# Patient Record
Sex: Female | Born: 1960
Health system: Southern US, Community
[De-identification: ages and names within clinical notes are randomized; demographics above are authoritative.]

## PROBLEM LIST (undated history)

## (undated) DIAGNOSIS — F419 Anxiety disorder, unspecified: Secondary | ICD-10-CM

## (undated) DIAGNOSIS — H269 Unspecified cataract: Secondary | ICD-10-CM

## (undated) DIAGNOSIS — R32 Unspecified urinary incontinence: Secondary | ICD-10-CM

## (undated) DIAGNOSIS — F32A Depression, unspecified: Secondary | ICD-10-CM

## (undated) DIAGNOSIS — F329 Major depressive disorder, single episode, unspecified: Secondary | ICD-10-CM

## (undated) DIAGNOSIS — F259 Schizoaffective disorder, unspecified: Secondary | ICD-10-CM

## (undated) DIAGNOSIS — K219 Gastro-esophageal reflux disease without esophagitis: Secondary | ICD-10-CM

## (undated) HISTORY — PX: OTHER SURGICAL HISTORY: SHX169

## (undated) HISTORY — DX: Depression, unspecified: F32.A

## (undated) HISTORY — PX: CATARACT EXTRACTION: SUR2

## (undated) HISTORY — DX: Gastro-esophageal reflux disease without esophagitis: K21.9

## (undated) HISTORY — PX: EYE SURGERY: SHX253

## (undated) HISTORY — DX: Anxiety disorder, unspecified: F41.9

---
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
## 1998-01-07 ENCOUNTER — Encounter: Admission: RE | Admit: 1998-01-07 | Discharge: 1998-04-07 | Payer: Self-pay | Admitting: Family Medicine

## 1998-04-04 ENCOUNTER — Emergency Department (HOSPITAL_COMMUNITY): Admission: EM | Admit: 1998-04-04 | Discharge: 1998-04-04 | Payer: Self-pay | Admitting: *Deleted

## 1998-04-11 ENCOUNTER — Encounter: Admission: RE | Admit: 1998-04-11 | Discharge: 1998-07-10 | Payer: Self-pay | Admitting: Family Medicine

## 1998-05-06 ENCOUNTER — Encounter: Payer: Self-pay | Admitting: Family Medicine

## 1998-05-06 ENCOUNTER — Ambulatory Visit (HOSPITAL_COMMUNITY): Admission: RE | Admit: 1998-05-06 | Discharge: 1998-05-06 | Payer: Self-pay | Admitting: Family Medicine

## 1998-06-06 ENCOUNTER — Inpatient Hospital Stay (HOSPITAL_COMMUNITY): Admission: AD | Admit: 1998-06-06 | Discharge: 1998-06-09 | Payer: Self-pay | Admitting: Psychiatry

## 1998-06-08 ENCOUNTER — Encounter (HOSPITAL_COMMUNITY): Payer: Self-pay | Admitting: Psychiatry

## 1998-07-11 ENCOUNTER — Encounter: Admission: RE | Admit: 1998-07-11 | Discharge: 1998-10-09 | Payer: Self-pay | Admitting: Family Medicine

## 1998-07-31 ENCOUNTER — Inpatient Hospital Stay (HOSPITAL_COMMUNITY): Admission: AD | Admit: 1998-07-31 | Discharge: 1998-08-04 | Payer: Self-pay | Admitting: Psychiatry

## 1998-12-10 ENCOUNTER — Encounter: Admission: RE | Admit: 1998-12-10 | Discharge: 1999-03-10 | Payer: Self-pay | Admitting: Family Medicine

## 1999-04-06 ENCOUNTER — Encounter: Admission: RE | Admit: 1999-04-06 | Discharge: 1999-07-05 | Payer: Self-pay

## 1999-08-24 ENCOUNTER — Inpatient Hospital Stay (HOSPITAL_COMMUNITY): Admission: EM | Admit: 1999-08-24 | Discharge: 1999-08-26 | Payer: Self-pay | Admitting: *Deleted

## 1999-08-27 ENCOUNTER — Other Ambulatory Visit: Admission: RE | Admit: 1999-08-27 | Discharge: 1999-09-09 | Payer: Self-pay

## 1999-09-30 ENCOUNTER — Encounter: Admission: RE | Admit: 1999-09-30 | Discharge: 1999-12-29 | Payer: Self-pay | Admitting: Family Medicine

## 1999-11-24 ENCOUNTER — Encounter: Payer: Self-pay | Admitting: Family Medicine

## 1999-11-24 ENCOUNTER — Ambulatory Visit (HOSPITAL_COMMUNITY): Admission: RE | Admit: 1999-11-24 | Discharge: 1999-11-24 | Payer: Self-pay | Admitting: Family Medicine

## 1999-12-03 ENCOUNTER — Inpatient Hospital Stay (HOSPITAL_COMMUNITY): Admission: EM | Admit: 1999-12-03 | Discharge: 1999-12-07 | Payer: Self-pay | Admitting: *Deleted

## 2000-01-14 ENCOUNTER — Other Ambulatory Visit: Admission: RE | Admit: 2000-01-14 | Discharge: 2000-01-14 | Payer: Self-pay | Admitting: Obstetrics

## 2000-01-21 ENCOUNTER — Inpatient Hospital Stay (HOSPITAL_COMMUNITY): Admission: EM | Admit: 2000-01-21 | Discharge: 2000-01-24 | Payer: Self-pay | Admitting: *Deleted

## 2000-02-12 ENCOUNTER — Ambulatory Visit (HOSPITAL_COMMUNITY): Admission: RE | Admit: 2000-02-12 | Discharge: 2000-02-12 | Payer: Self-pay | Admitting: *Deleted

## 2000-02-13 ENCOUNTER — Emergency Department (HOSPITAL_COMMUNITY): Admission: EM | Admit: 2000-02-13 | Discharge: 2000-02-14 | Payer: Self-pay

## 2000-02-24 ENCOUNTER — Inpatient Hospital Stay (HOSPITAL_COMMUNITY): Admission: EM | Admit: 2000-02-24 | Discharge: 2000-02-28 | Payer: Self-pay | Admitting: *Deleted

## 2000-02-29 ENCOUNTER — Other Ambulatory Visit (HOSPITAL_COMMUNITY): Admission: RE | Admit: 2000-02-29 | Discharge: 2000-03-10 | Payer: Self-pay | Admitting: Psychiatry

## 2000-03-21 ENCOUNTER — Ambulatory Visit (HOSPITAL_COMMUNITY): Admission: RE | Admit: 2000-03-21 | Discharge: 2000-03-21 | Payer: Self-pay | Admitting: Obstetrics

## 2000-03-21 ENCOUNTER — Encounter: Payer: Self-pay | Admitting: Obstetrics

## 2000-05-08 ENCOUNTER — Emergency Department (HOSPITAL_COMMUNITY): Admission: EM | Admit: 2000-05-08 | Discharge: 2000-05-08 | Payer: Self-pay | Admitting: Emergency Medicine

## 2000-05-17 ENCOUNTER — Encounter: Admission: RE | Admit: 2000-05-17 | Discharge: 2000-08-15 | Payer: Self-pay | Admitting: Family Medicine

## 2000-05-30 ENCOUNTER — Inpatient Hospital Stay (HOSPITAL_COMMUNITY): Admission: EM | Admit: 2000-05-30 | Discharge: 2000-06-03 | Payer: Self-pay | Admitting: Psychiatry

## 2000-06-19 ENCOUNTER — Inpatient Hospital Stay (HOSPITAL_COMMUNITY): Admission: EM | Admit: 2000-06-19 | Discharge: 2000-06-27 | Payer: Self-pay | Admitting: *Deleted

## 2000-08-08 ENCOUNTER — Inpatient Hospital Stay (HOSPITAL_COMMUNITY): Admission: EM | Admit: 2000-08-08 | Discharge: 2000-08-10 | Payer: Self-pay | Admitting: *Deleted

## 2000-08-11 ENCOUNTER — Other Ambulatory Visit (HOSPITAL_COMMUNITY): Admission: RE | Admit: 2000-08-11 | Discharge: 2000-08-17 | Payer: Self-pay | Admitting: Psychiatry

## 2000-08-19 ENCOUNTER — Encounter: Payer: Self-pay | Admitting: Emergency Medicine

## 2000-08-19 ENCOUNTER — Observation Stay (HOSPITAL_COMMUNITY): Admission: EM | Admit: 2000-08-19 | Discharge: 2000-08-20 | Payer: Self-pay | Admitting: Emergency Medicine

## 2000-08-22 ENCOUNTER — Inpatient Hospital Stay (HOSPITAL_COMMUNITY): Admission: EM | Admit: 2000-08-22 | Discharge: 2000-08-26 | Payer: Self-pay | Admitting: *Deleted

## 2000-11-26 ENCOUNTER — Inpatient Hospital Stay (HOSPITAL_COMMUNITY): Admission: EM | Admit: 2000-11-26 | Discharge: 2000-11-30 | Payer: Self-pay | Admitting: Psychiatry

## 2001-03-03 ENCOUNTER — Encounter: Admission: RE | Admit: 2001-03-03 | Discharge: 2001-06-01 | Payer: Self-pay | Admitting: Family Medicine

## 2001-03-07 ENCOUNTER — Inpatient Hospital Stay (HOSPITAL_COMMUNITY): Admission: AD | Admit: 2001-03-07 | Discharge: 2001-03-11 | Payer: Self-pay | Admitting: Psychiatry

## 2001-05-24 ENCOUNTER — Inpatient Hospital Stay (HOSPITAL_COMMUNITY): Admission: EM | Admit: 2001-05-24 | Discharge: 2001-05-30 | Payer: Self-pay | Admitting: Psychiatry

## 2001-07-22 ENCOUNTER — Inpatient Hospital Stay (HOSPITAL_COMMUNITY): Admission: AD | Admit: 2001-07-22 | Discharge: 2001-08-02 | Payer: Self-pay | Admitting: Psychiatry

## 2001-08-03 ENCOUNTER — Other Ambulatory Visit (HOSPITAL_COMMUNITY): Admission: RE | Admit: 2001-08-03 | Discharge: 2001-08-07 | Payer: Self-pay | Admitting: *Deleted

## 2001-09-04 ENCOUNTER — Encounter: Payer: Self-pay | Admitting: Family Medicine

## 2001-09-04 ENCOUNTER — Ambulatory Visit (HOSPITAL_COMMUNITY): Admission: RE | Admit: 2001-09-04 | Discharge: 2001-09-04 | Payer: Self-pay | Admitting: Family Medicine

## 2001-09-25 ENCOUNTER — Inpatient Hospital Stay (HOSPITAL_COMMUNITY): Admission: EM | Admit: 2001-09-25 | Discharge: 2001-09-29 | Payer: Self-pay | Admitting: Psychiatry

## 2001-11-06 ENCOUNTER — Inpatient Hospital Stay (HOSPITAL_COMMUNITY): Admission: EM | Admit: 2001-11-06 | Discharge: 2001-11-11 | Payer: Self-pay | Admitting: Psychiatry

## 2001-11-21 ENCOUNTER — Inpatient Hospital Stay (HOSPITAL_COMMUNITY): Admission: EM | Admit: 2001-11-21 | Discharge: 2001-12-05 | Payer: Self-pay | Admitting: Psychiatry

## 2001-11-26 ENCOUNTER — Encounter: Payer: Self-pay | Admitting: *Deleted

## 2001-11-26 ENCOUNTER — Emergency Department (HOSPITAL_COMMUNITY): Admission: EM | Admit: 2001-11-26 | Discharge: 2001-11-26 | Payer: Self-pay | Admitting: *Deleted

## 2001-11-27 ENCOUNTER — Emergency Department (HOSPITAL_COMMUNITY): Admission: EM | Admit: 2001-11-27 | Discharge: 2001-11-27 | Payer: Self-pay | Admitting: Emergency Medicine

## 2001-12-05 ENCOUNTER — Inpatient Hospital Stay (HOSPITAL_COMMUNITY): Admission: EM | Admit: 2001-12-05 | Discharge: 2001-12-16 | Payer: Self-pay | Admitting: Psychiatry

## 2001-12-12 ENCOUNTER — Ambulatory Visit (HOSPITAL_COMMUNITY): Admission: RE | Admit: 2001-12-12 | Discharge: 2001-12-12 | Payer: Self-pay | Admitting: Psychiatry

## 2001-12-12 ENCOUNTER — Encounter (HOSPITAL_COMMUNITY): Payer: Self-pay | Admitting: Psychiatry

## 2001-12-18 ENCOUNTER — Other Ambulatory Visit (HOSPITAL_COMMUNITY): Admission: RE | Admit: 2001-12-18 | Discharge: 2001-12-20 | Payer: Self-pay | Admitting: Psychiatry

## 2001-12-20 ENCOUNTER — Inpatient Hospital Stay (HOSPITAL_COMMUNITY): Admission: EM | Admit: 2001-12-20 | Discharge: 2002-01-08 | Payer: Self-pay | Admitting: Psychiatry

## 2002-01-22 ENCOUNTER — Other Ambulatory Visit (HOSPITAL_COMMUNITY): Admission: RE | Admit: 2002-01-22 | Discharge: 2002-01-26 | Payer: Self-pay | Admitting: Psychiatry

## 2002-05-16 ENCOUNTER — Inpatient Hospital Stay (HOSPITAL_COMMUNITY): Admission: EM | Admit: 2002-05-16 | Discharge: 2002-05-29 | Payer: Self-pay | Admitting: Psychiatry

## 2003-04-27 DIAGNOSIS — R32 Unspecified urinary incontinence: Secondary | ICD-10-CM

## 2003-04-27 HISTORY — DX: Unspecified urinary incontinence: R32

## 2003-07-12 ENCOUNTER — Inpatient Hospital Stay (HOSPITAL_COMMUNITY): Admission: RE | Admit: 2003-07-12 | Discharge: 2003-07-19 | Payer: Self-pay | Admitting: Psychiatry

## 2003-09-27 ENCOUNTER — Ambulatory Visit (HOSPITAL_COMMUNITY): Admission: RE | Admit: 2003-09-27 | Discharge: 2003-09-27 | Payer: Self-pay | Admitting: Obstetrics

## 2004-01-29 ENCOUNTER — Ambulatory Visit (HOSPITAL_BASED_OUTPATIENT_CLINIC_OR_DEPARTMENT_OTHER): Admission: RE | Admit: 2004-01-29 | Discharge: 2004-01-29 | Payer: Self-pay | Admitting: General Surgery

## 2004-01-29 ENCOUNTER — Ambulatory Visit (HOSPITAL_COMMUNITY): Admission: RE | Admit: 2004-01-29 | Discharge: 2004-01-29 | Payer: Self-pay | Admitting: General Surgery

## 2004-01-29 ENCOUNTER — Encounter (INDEPENDENT_AMBULATORY_CARE_PROVIDER_SITE_OTHER): Payer: Self-pay | Admitting: Specialist

## 2004-08-20 ENCOUNTER — Inpatient Hospital Stay (HOSPITAL_COMMUNITY): Admission: RE | Admit: 2004-08-20 | Discharge: 2004-08-25 | Payer: Self-pay | Admitting: Psychiatry

## 2004-08-20 ENCOUNTER — Ambulatory Visit: Payer: Self-pay | Admitting: Psychiatry

## 2004-10-29 ENCOUNTER — Emergency Department (HOSPITAL_COMMUNITY): Admission: EM | Admit: 2004-10-29 | Discharge: 2004-10-29 | Payer: Self-pay | Admitting: Emergency Medicine

## 2005-04-01 ENCOUNTER — Inpatient Hospital Stay (HOSPITAL_COMMUNITY): Admission: RE | Admit: 2005-04-01 | Discharge: 2005-04-06 | Payer: Self-pay | Admitting: Psychiatry

## 2005-04-02 ENCOUNTER — Ambulatory Visit: Payer: Self-pay | Admitting: Psychiatry

## 2005-04-03 ENCOUNTER — Encounter: Payer: Self-pay | Admitting: *Deleted

## 2005-05-22 ENCOUNTER — Emergency Department (HOSPITAL_COMMUNITY): Admission: EM | Admit: 2005-05-22 | Discharge: 2005-05-22 | Payer: Self-pay | Admitting: Emergency Medicine

## 2005-09-17 ENCOUNTER — Encounter (INDEPENDENT_AMBULATORY_CARE_PROVIDER_SITE_OTHER): Payer: Self-pay | Admitting: *Deleted

## 2005-09-17 ENCOUNTER — Ambulatory Visit (HOSPITAL_COMMUNITY): Admission: RE | Admit: 2005-09-17 | Discharge: 2005-09-17 | Payer: Self-pay | Admitting: Gastroenterology

## 2005-12-10 IMAGING — CT CT CHEST W/ CM
2 of 3 series · 15 of 36 positions shown, 18 images · IV contrast (APPLIED)
Comparison: chest radiograph of last evening

CLINICAL DATA: followup of abnormal chest radiograph
CHEST CT WITH CONTRAST:
TECHNIQUE: Multidetector CT imaging of the chest was performed following the standard protocol during bolus administration of intravenous contrast.
Contrast:  100 cc Omnipaque 300

[Series 2: routine chest 5.0 st · axial · 0.59mm/px · z∈[-325,-85]mm · 12 of 58 slices shown, 15 images]
[im 5/58  mediastinal]
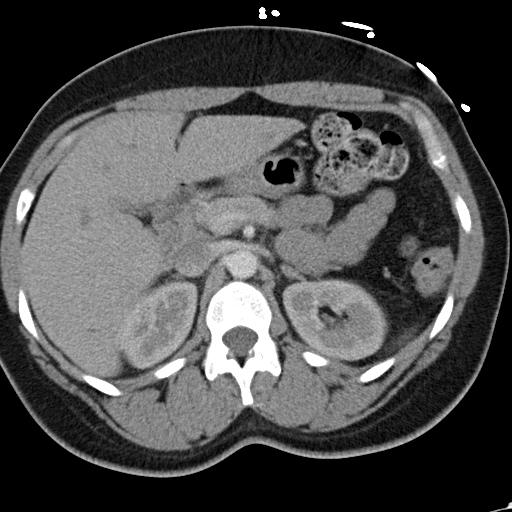
[im 5/58  lung]
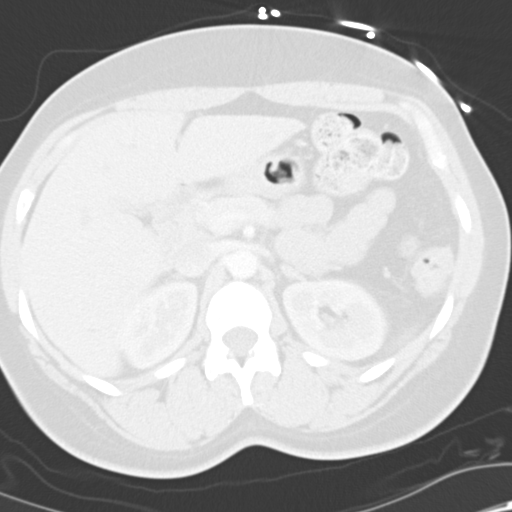
[im 9/58  lung]
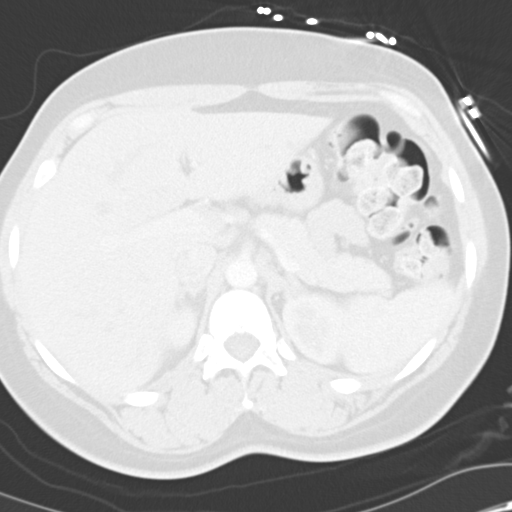
[im 13/58  lung]
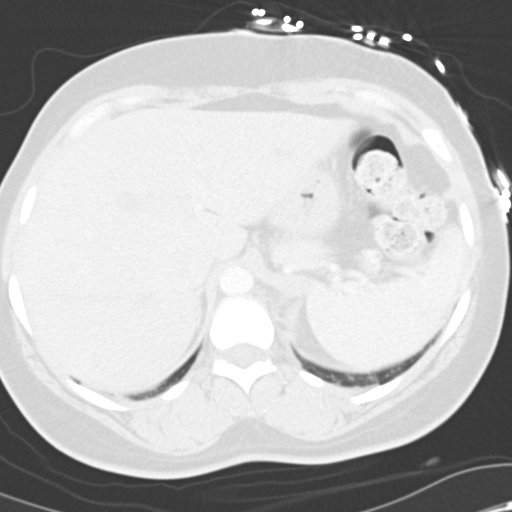
[im 17/58  lung]
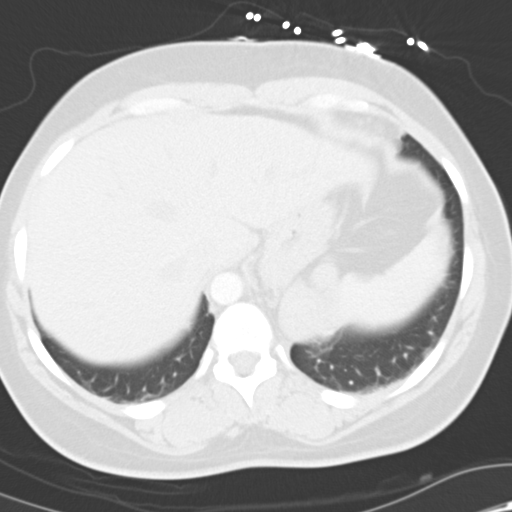
[im 22/58  mediastinal]
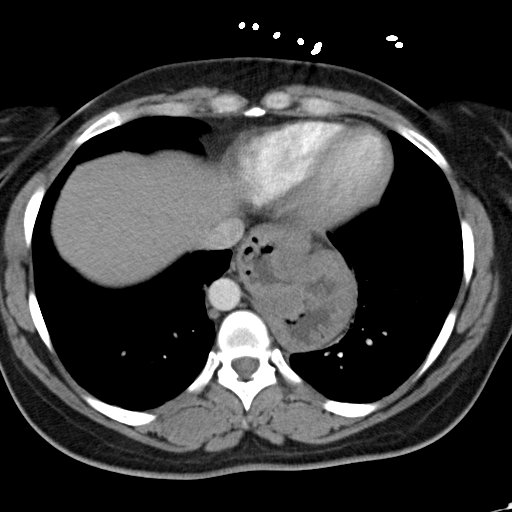
[im 22/58  lung]
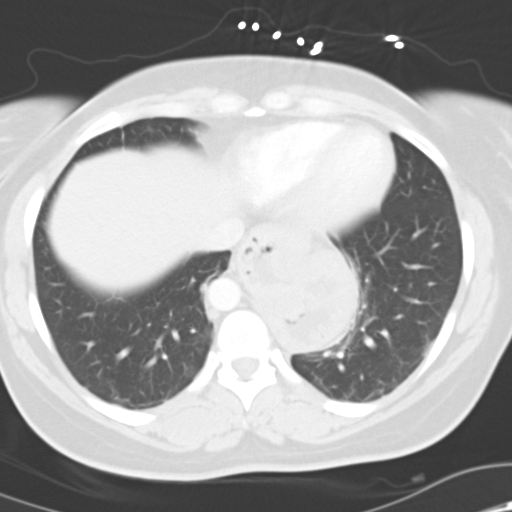
[im 26/58  lung]
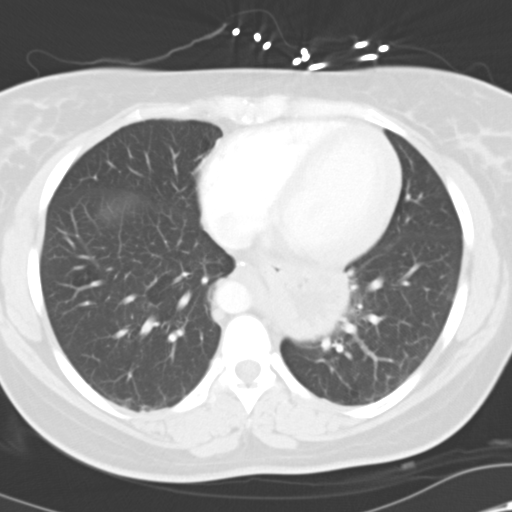
[im 32/58  lung]
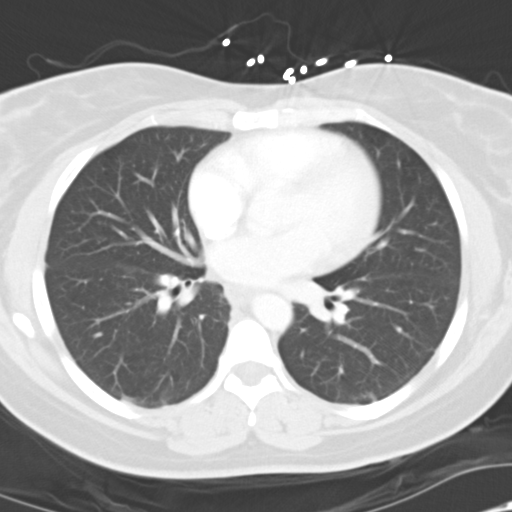
[im 36/58  lung]
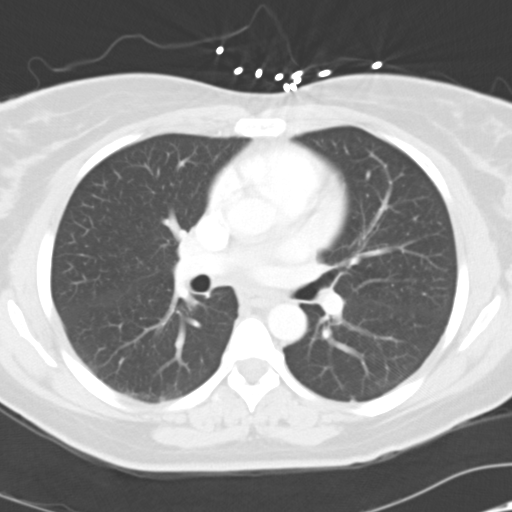
[im 41/58  mediastinal]
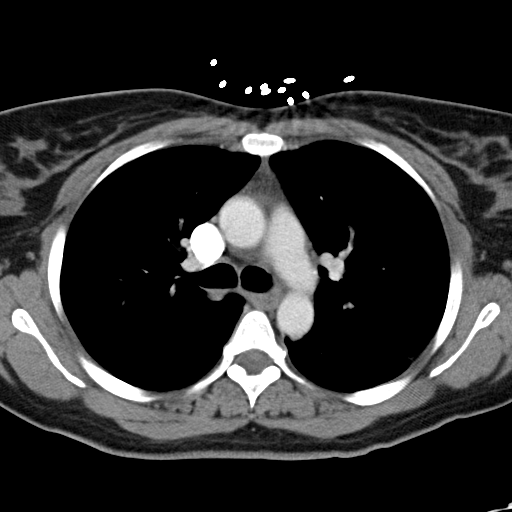
[im 41/58  lung]
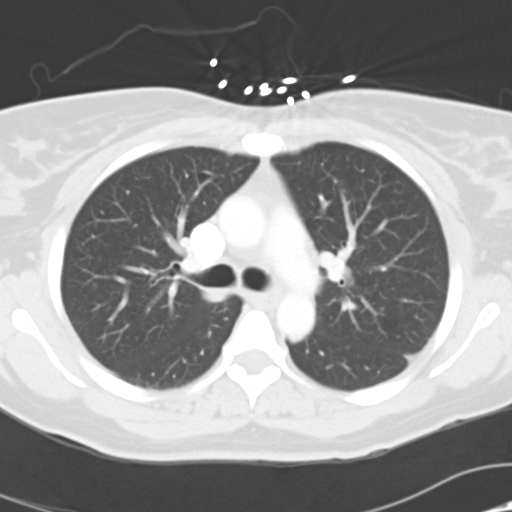
[im 45/58  lung]
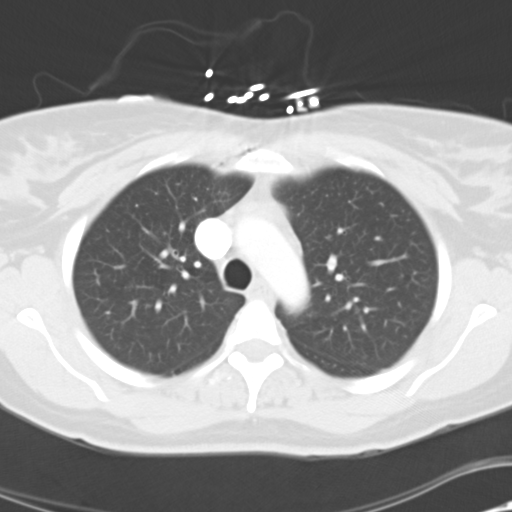
[im 49/58  lung]
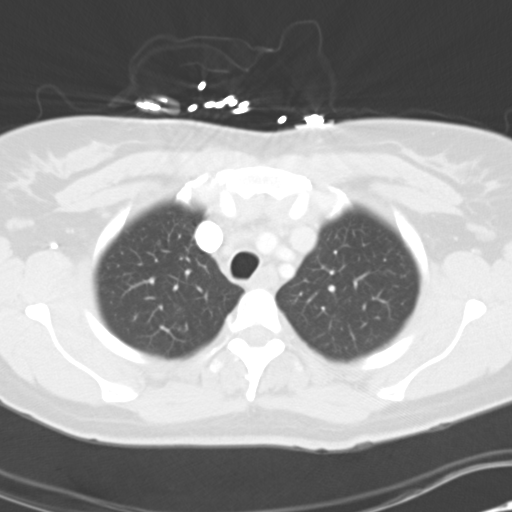
[im 53/58  lung]
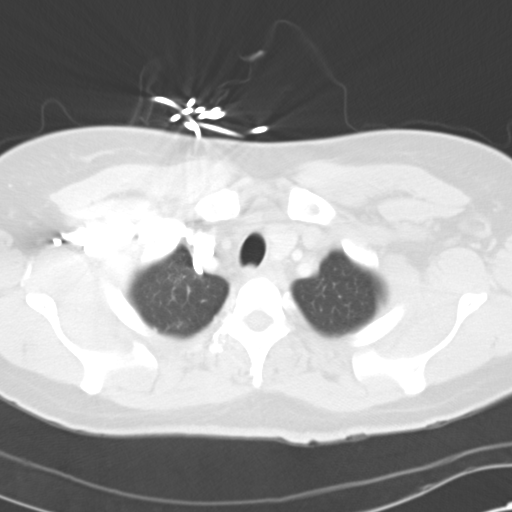

[Series 4: routine chest 2.0 st · coronal · 0.59mm/px · 3 of 106 slices shown]
[im 22/106  lung]
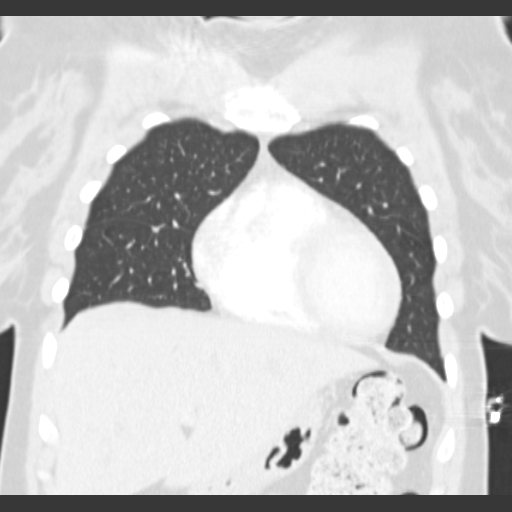
[im 43/106  lung]
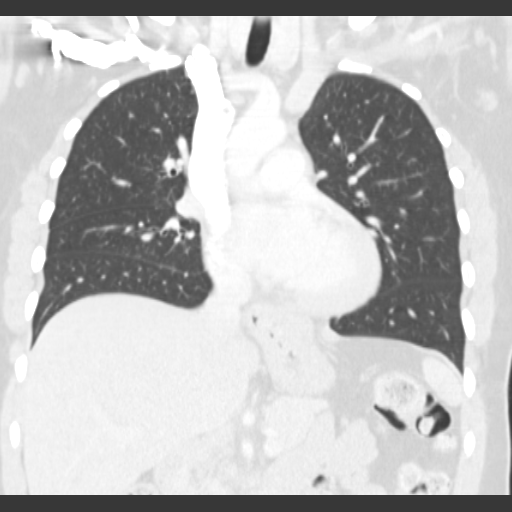
[im 64/106  lung]
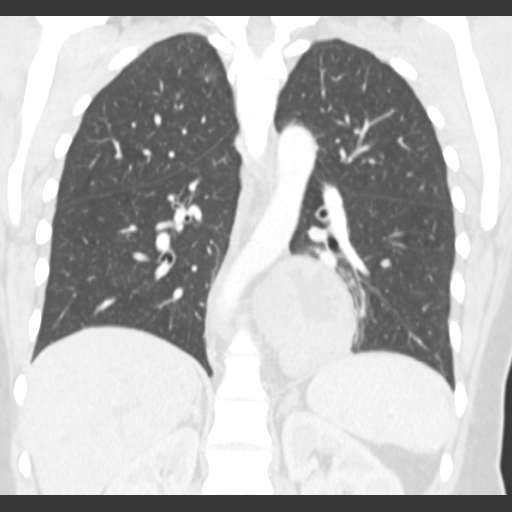

[15 of 36 positions shown; findings below may reference images not displayed]

FINDINGS: Lung windows demonstrate a right-sided lung nodule at 4mm on image #26.  There is likely two millimetric nodules together in the left upper lobe on image # 19.  A faint subpleural 3 to 4mm nodule is seen in the left lung on image # 29.  There is no evidence of pneumonia.

Soft tissue windows demonstrate heart size at the upper limits of normal.  No pericardial or pleural effusion.  Calcified mediastinal lymph nodes.  
The retrocardiac density corresponds to a moderate-sized hiatal hernia with the proximal half of the stomach positioned within the lower chest.  Apparent soft tissue thickening within the proximal stomach on image # 35 is likely due to under distention and redundancy at the GE junction.  Small prevascular lymph node on image # 15 is not pathologic.  Limited imaging of the upper abdomen is otherwise within normal limits.
Bone windows demonstrate mild collateral vessel formation about the upper right chest which is likely secondary to positional central venous insufficiency. No destructive osseous lesion.
IMPRESSION: 1.  The plain film abnormality corresponds to a moderate-sized hiatal hernia.  Apparent soft tissue thickening along the proximal stomach is felt to be due to redundancy.  However, consider nonemergent outpatient barium swallow to exclude esophagitis or proximal gastritis.  
2.  Scattered small lung nodules, the largest measures 4mm.  Recommend followup in approximately 9 months with chest CT to confirm stability.
3.  No other explanation within the chest for patient?s symptoms.

## 2006-06-07 ENCOUNTER — Ambulatory Visit (HOSPITAL_COMMUNITY): Admission: RE | Admit: 2006-06-07 | Discharge: 2006-06-07 | Payer: Self-pay | Admitting: Family Medicine

## 2006-07-21 ENCOUNTER — Encounter: Admission: RE | Admit: 2006-07-21 | Discharge: 2006-07-21 | Payer: Self-pay | Admitting: *Deleted

## 2006-12-28 ENCOUNTER — Encounter: Admission: RE | Admit: 2006-12-28 | Discharge: 2007-03-28 | Payer: Self-pay | Admitting: Family Medicine

## 2009-02-25 ENCOUNTER — Other Ambulatory Visit: Admission: RE | Admit: 2009-02-25 | Discharge: 2009-02-25 | Payer: Self-pay | Admitting: Family Medicine

## 2009-03-25 ENCOUNTER — Encounter: Admission: RE | Admit: 2009-03-25 | Discharge: 2009-03-25 | Payer: Self-pay | Admitting: Family Medicine

## 2009-12-01 IMAGING — US US TRANSVAGINAL NON-OB
1 series · 14 of 25 positions shown · non-contrast
Comparison: None

CLINICAL DATA: Pelvic mass on physical exam

TRANSABDOMINAL AND TRANSVAGINAL ULTRASOUND OF PELVIS
TECHNIQUE: Both transabdominal and transvaginal ultrasound
examinations of the pelvis were performed including evaluation of
the uterus, ovaries, adnexal regions, and pelvic cul-de-sac.

[Series 1: us transvaginal non-ob · 0.35mm/px · 14 of 81 slices shown]
[im 1/81]
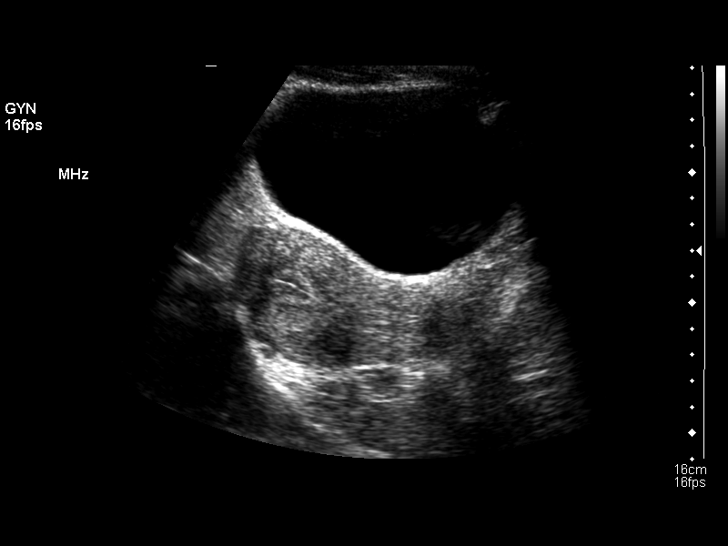
[im 7/81]
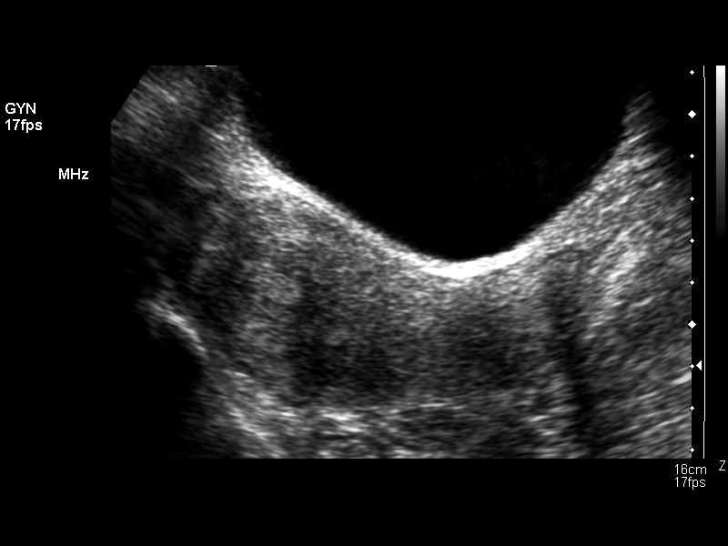
[im 14/81]
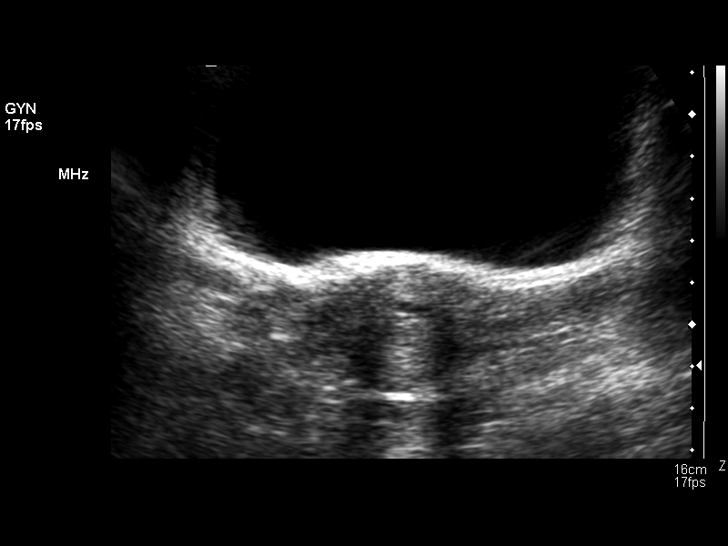
[im 21/81]
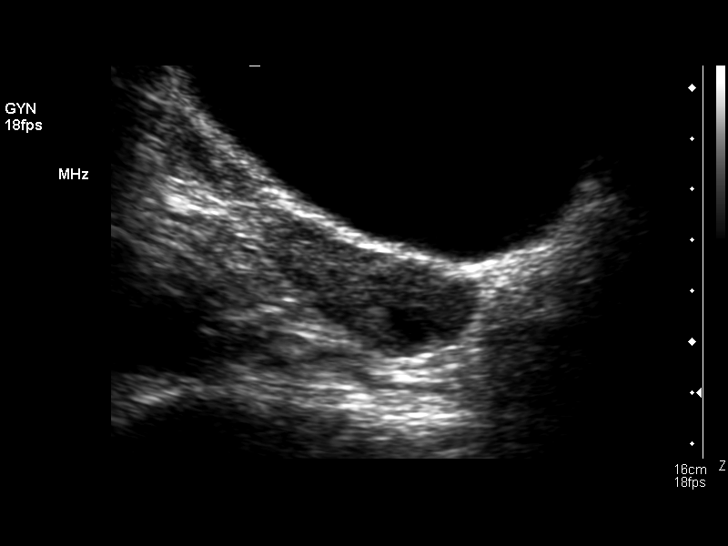
[im 27/81]
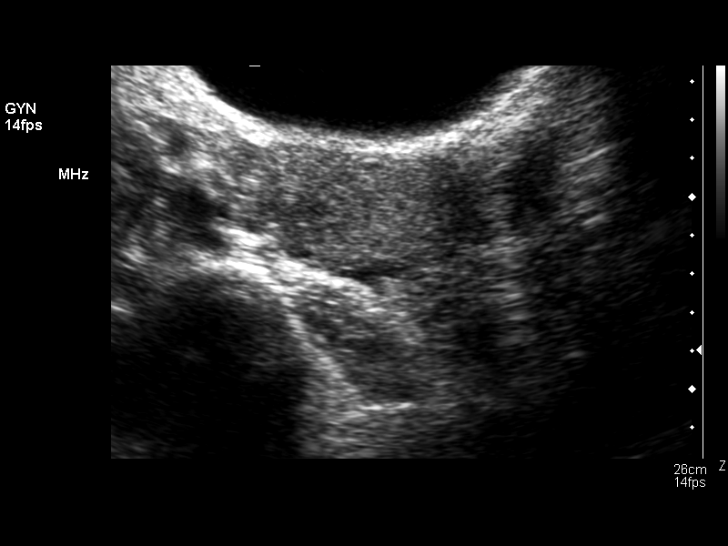
[im 31/81]
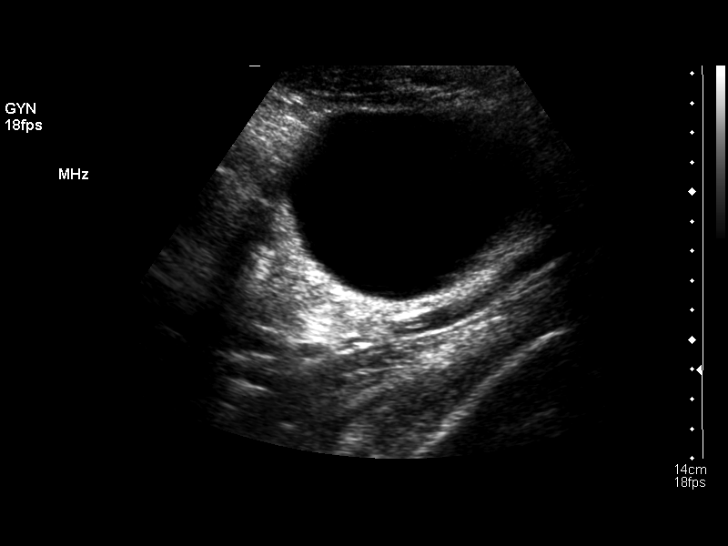
[im 37/81]
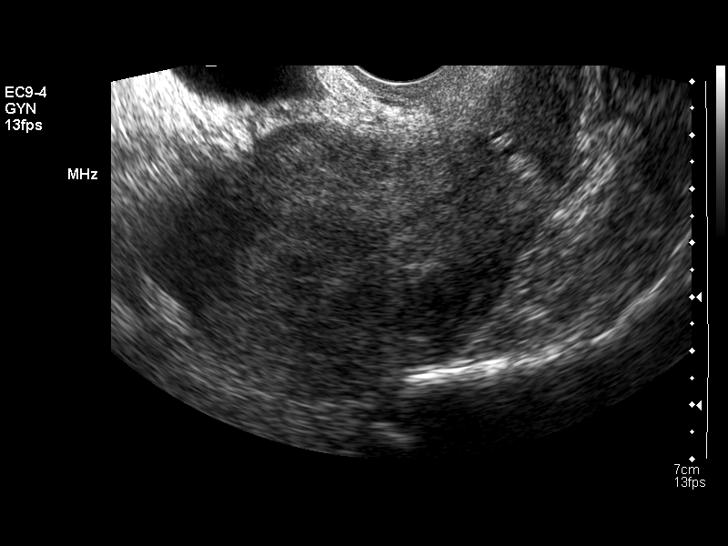
[im 44/81]
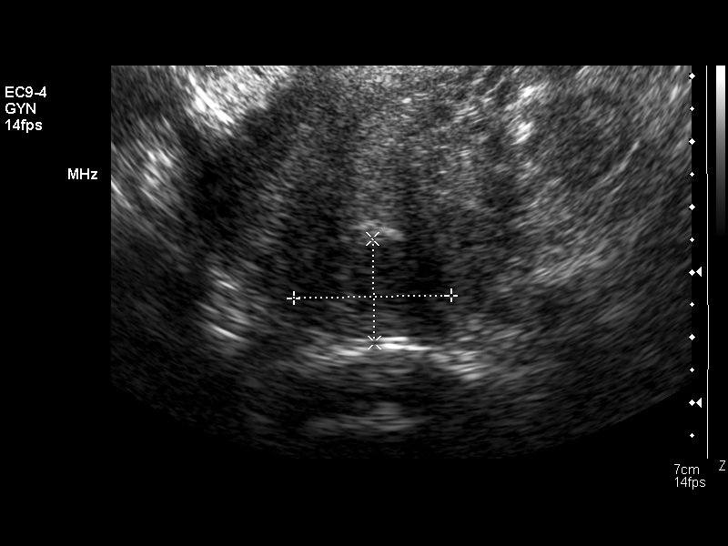
[im 51/81]
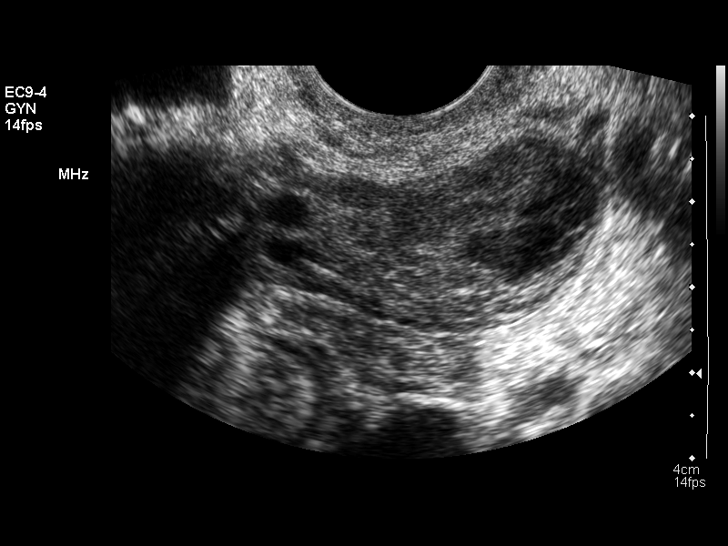
[im 54/81]
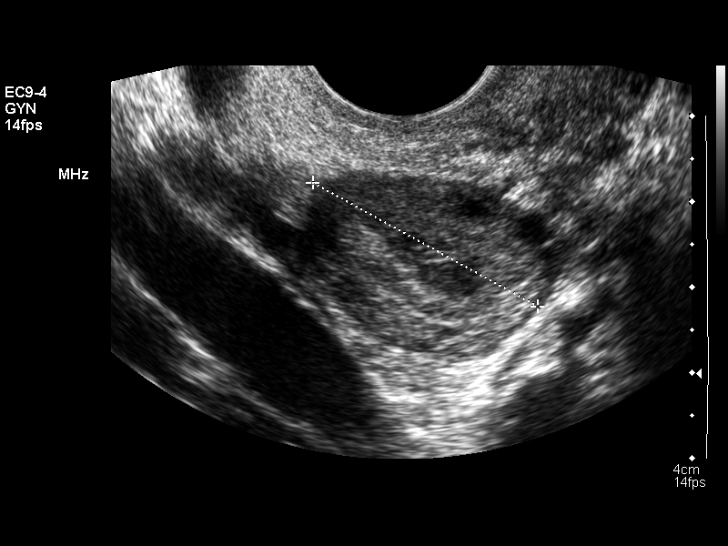
[im 61/81]
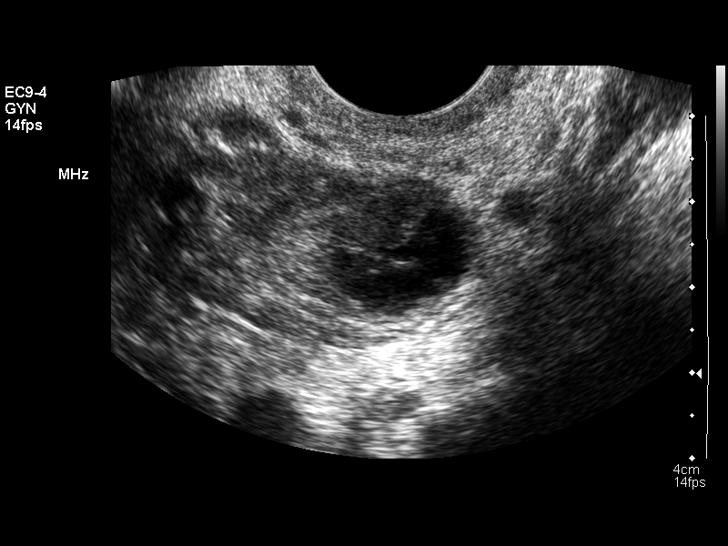
[im 67/81]
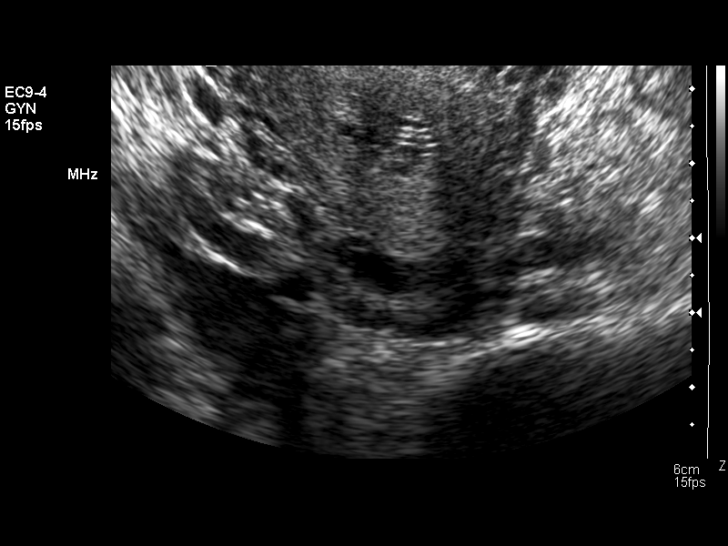
[im 74/81]
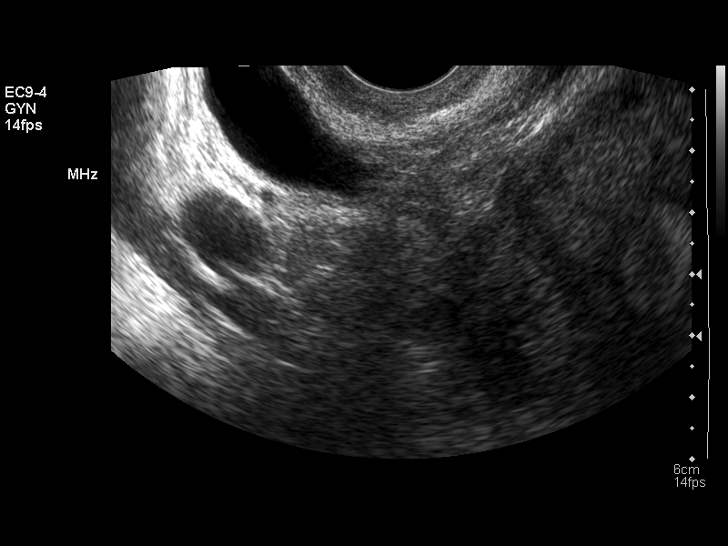
[im 81/81]
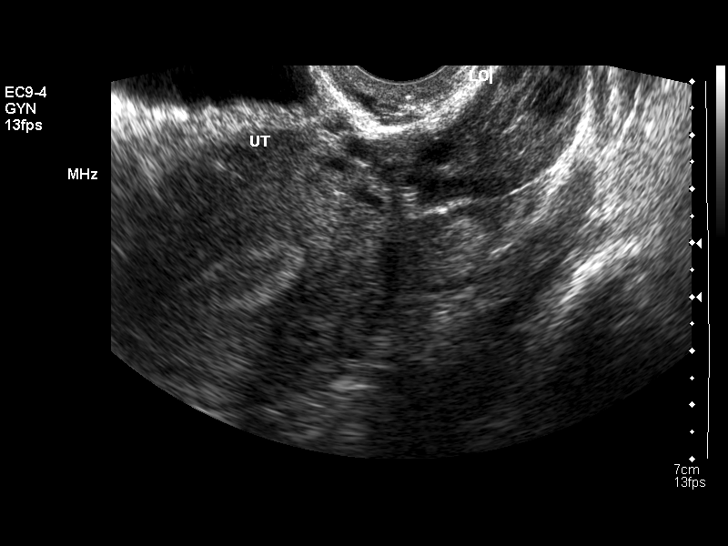

[14 of 25 positions shown; findings below may reference images not displayed]

FINDINGS: Uterus: The uterus measures 9.2 cm sagittally, with a depth of
cm and width of 5.6 cm.  In the posterior midbody of the uterus
there is a fibroid of 2.4 x 1.6 x 1.9 cm.

Endometrium:The endometrium measures 17.1 mm in thickness which is
within normal limits for this menstruating female.

Right Ovary :The right ovary measures 3.1 x 1.1 x 2.6 cm with small
follicles present.

Left Ovary :The left ovary measures 4.5 x 2.0 x 3.0 cm with small
follicles present.

Other Findings:  No free fluid is seen.
IMPRESSION: 1.  2.4 cm posterior midbody uterine fibroid.
2.  The endometrium is normal in thickness.
3.  Small ovarian follicles.

## 2010-09-11 NOTE — H&P (Signed)
[name], [name] NO.:  [id number]   MEDICAL RECORD NO.:  [id number]                   PATIENT TYPE:  IPS   LOCATION:  0401                                 FACILITY:  [facility]   PHYSICIAN:  Geoffery Lyons, M.D.                   DATE OF BIRTH:  [birth year]   DATE OF ADMISSION:  07/12/2003  DATE OF DISCHARGE:                         PSYCHIATRIC ADMISSION ASSESSMENT   IDENTIFYING INFORMATION:  This is a voluntary admission.  This is a 50-year-  old single black female.  The patient presented at [facility], reporting recurring suicidal ideation with a plan to overdose on her  psych medications.  She could not identify the precipitant.  She was quite  anxious and fearful, feared she would act out on her suicidal impulses.  She  is well known to this facility.  She has a diagnosis of schizoaffective  disorder.  She was cooperative but a poor historian on admission.   PAST PSYCHIATRIC HISTORY:  She has had numerous inpatient stays at [facility], [facility], [facility], [place].  She  states that her first break was in college.   SOCIAL HISTORY:  She has had some college prior to being rendered disabled  through mental illness.  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  She is a single parent with a 35 year old daughter.   FAMILY HISTORY:  She states she has a maternal aunt who has mental illness  and a paternal cousin who did in fact complete suicide.   ALCOHOL AND DRUG ABUSE:  She smokes less than 1 pack per day.   PAST MEDICAL HISTORY:  Primary care [name] is Dr. Laurey Morale.  She is also  followed by Dr. Delena Bali for GERD and takes Nexium daily.   ALLERGIES:  She acknowledges drug allergies to AMPICILLIN, she gets a rash  and hives, and COGENTIN, messes up her thoughts.   POSITIVE PHYSICAL FINDINGS:  PHYSICAL EXAMINATION:  Reveals a well-  nourished, well-developed black female  who has a somewhat altered mental  status at the moment.  She seems somewhat preoccupied, maybe even confused.  HEENT:  Within normal limits.  CHEST:  Clear to auscultation.  HEART:  Regular rate and rhythm albeit it was tachycardic at about 120.  ABDOMEN:  Soft, with normoactive bowel sounds.  There was no mass, megaly or  tenderness.  She reports having been diagnosed with a left ovarian cyst  awhile ago but has not had problems with that recently.  MUSCULOSKELETAL SYSTEM:  Reveals no clubbing, cyanosis, or edema.  NEUROLOGICALLY:  Cranial nerves II-XII are grossly intact.   MENTAL STATUS EXAM:  She was well groomed, appropriately and casually  dressed.  She is alert and oriented.  Her mood is somewhat dysphoric.  Her  attention:  She seems somewhat preoccupied although not actually responding  to internal stimuli.  Her thought processes were logical and goal directed.  She is not sure today that she feels suicidal, however she remains somewhat  guarded and anxious.  Judgment and insight are intact.  Concentration and  memory are intact.  Her intelligence level is at least average, and  substance abuse she denies.   ADMISSION DIAGNOSES:   AXIS I:  1. Schizoaffective disorder with recent adjustment of her Clozaril by the     patient.  2. History for bulimia.   AXIS II:  Borderline personality disorder.   AXIS III:  Gastroesophageal reflux disease.   AXIS IV:  Moderate.   AXIS V:  Global assessment of function is 35.   PLAN:  The patient will be admitted to provide safety and to reestablish  compliance and medication levels.  Once she is restabilized she will be  returned to the community.     [name], P.A.-C.               Geoffery Lyons, M.D.    MD/MEDQ  D:  07/13/2003  T:  07/13/2003  Job:  [id number]

## 2010-09-11 NOTE — Discharge Summary (Signed)
NAME:  [name], GUDGEL NO.:  [id number]   MEDICAL RECORD NO.:  [id number]          PATIENT TYPE:  IPS   LOCATION:  0407                          FACILITY:  [facility]   PHYSICIAN:  Jeanice Lim, M.D. DATE OF BIRTH:  Mar 05, 1961   DATE OF ADMISSION:  04/01/2005  DATE OF DISCHARGE:  04/06/2005                                 DISCHARGE SUMMARY   IDENTIFYING DATA:  This is a 50 year old single African-American female  voluntarily admitted on April 01, 2005 with a history of paranoid  ideation.  She does not feel in control.  She went to the food store  yesterday and was not feeling right.  She did not have the right debit card  out, stated that she made the whole store shut down because of her mistake.  Manager was looking at her, and she had to leave.  She got lost going home,  complained of racing thoughts and distortions in thinking and perception.   MEDICAL PROBLEMS:  Include gastroesophageal reflux disease.   FAMILY HISTORY:  Positive for schizophrenia.  Denied alcohol or substance  abuse history.   MEDICATIONS:  1.  Clozaril 450 mg daily.  2.  Lamictal 150.  3.  Nexium 40.  4.  Trazodone q.h.s.   DRUG ALLERGIES:  COGENTIN and ATIVAN as per patient.   PHYSICAL EXAMINATION:  Neurologic examination essentially within normal  limits.   MENTAL STATUS EXAM:  Alert and cooperative with fair eye contact.  Speech  clear, normal rate.  Mood anxious.  Affect dysphoric and somewhat blunted.  Thought process positive for paranoia, otherwise, coherent with some  derailing.  Cognitively, grossly intact.  Judgment and insight were  impaired.   ADMISSION DIAGNOSES:  AXIS I:  Schizoaffective disorder, bipolar type,  versus schizophrenia acute exacerbation partially stabilized on Clozaril  until recently.  AXIS II:  Deferred.  AXIS III:  Gastroesophageal reflux disease.  AXIS IV:  Moderate problem with primary support group and other psychosocial  issues.  AXIS V:   30/60.   HOSPITAL COURSE:  Patient was admitted, ordered routine p.r.n. medications,  underwent further monitoring, was encouraged to participate in individual,  group and milieu therapy.  Patient was optimized on medications with  Lamictal decreased, Clozaril optimized for more optimal control of clear  delusional and paranoid thinking which seemed to have worsened recently.  Stressors unclear, however, patient had clearly been responding to Clozaril  and tolerating it well until recently.  Compliance, patient reported was  intact.  She clearly was aware of risk/benefit ratio and alternative  treatments regarding Clozaril, rare risk of TD and EPS and other metabolic  issues, glucose, diabetes, triglycerides, weight gain and seizure risk as  well as sedation.  Patient reported none of these side effects mattered due  to the fact that she had had a robust response and this was the only  medication that had ever worked for her.  Patient had been paranoid,  reporting increased voices and racing thoughts all day, nonstop.  This was  wearing her out.  As dose was increased she reported mild sedation  but  significant improvement in mood and thinking.  She continued to improve as  she adjusted the medications, and she was discharged in improved condition  more reality-based, no overt psychotic symptoms, denied hallucinations,  denies mood swings, denied any suicidal or homicidal ideation thoughts.  She  reported motivation to be compliant with follow-up plan and happy with her  response to the Clozaril as well as aftercare plan.   DISCHARGE MEDICATIONS:  The patient was given medication education again at  time of discharge and was discharged on:  1.  Artane 2 mg q.h.s.  2.  Lamictal 100 mg at 6:00 p.m.  3.  Trazodone 200 mg q.h.s.  4.  Clozaril 525 mg at 8:00 p.m.  5.  Nexium 40 mg as directed.  CBC to be obtained every week for 4 weeks despite her having been on a bi-  monthly scheduled  due to risks in increasing Clozaril it is advisable that  patient have more frequent checks for the near future to ensure no affect on  white cells.   FOLLOW UP:  Patient is to follow up with Dr.  Lang Snow on April 13, 2005 at  11:00 a.m. and Cain Saupe on Thursday, April 08, 2005.   DISCHARGE DIAGNOSES:  AXIS I:  Schizoaffective disorder, bipolar type,  versus schizophrenia acute exacerbation partially stabilized on Clozaril  until recently.  AXIS II:  Deferred.  AXIS III:  Gastroesophageal reflux disease.  AXIS IV:  Moderate problem with primary support group and other psychosocial  issues.  AXIS V:  Global assessment of function on discharge 50-55.      Jeanice Lim, M.D.  Electronically Signed     JEM/MEDQ  D:  04/22/2005  T:  04/23/2005  Job:  [id number]

## 2010-09-11 NOTE — H&P (Signed)
[facility]  Patient:    [name], [name]                         MRN: [id number] Adm. Date:  [id number] Attending:  Otilio Hernandez                         History and Physical  IDENTIFYING INFORMATION:  [name] is a 50 year old divorced black female who is admitted with a history of of increasing depression and suicidal ideation.  HISTORY OF PRESENT ILLNESS:  The patient has a long history of schizoaffective disorder.  She has recently been hospitalized on the medical floor for persistent nausea and vomiting but reports no significant findings or benefit from her treatment.  She has remained quite nauseated and has felt increasingly depressed with auditory hallucinations, decreased sleep, decreased appetite, decreased energy and increasing suicidal ideation.  She does not feel safe and it is felt that inpatient stabilization was necessary.  PAST PSYCHIATRIC HISTORY:  The patient has been followed by [name]. She had just been discharged from [name] on August 10, 2000. She had been discharged on Risperdal 0.5 mg q.h.s., Topamax 25 mg t.i.d. and 100 mg q.h.s., Haldol 4 mg t.i.d., Cogentin 2 mg t.i.d.  She reports that she has been taken off of the Topamax and Risperdal and remains on the Haldol Decanoate as well as Haldol 5 mg q.i.d.  She has recently been started on Ativan 1 mg b.i.d. or t.i.d. but states that she has felt worse since being on this medication.  She had previously done very well with Effexor but had a questionable seizure and has been off the medication.  She has been on numerous other antidepressants in the past but without benefit or in the case of Prozac causing increased agitation.  PAST MEDICAL HISTORY:  The patient is followed by [name].  She has had persistent nausea and vomiting recently and has a past history of duodenal ulcers and gastroesophageal reflux disorder.  She also has a history of  low back pain.  She has been on Prevacid 30 mg q.d.  She has no known drug allergies.  SOCIAL HISTORY:  The patient is divorced and lives with her 94-year-old daughter.  She is very involved and supportive of her daughter and is anxious about being away from her while in the Hernandez.  The patient is unemployed and is on disability for her psychiatric illness.  She denies any history of drug or alcohol abuse.  FAMILY HISTORY:  The patients aunt has a history of schizophrenia, and a cousin has a history of committing suicide.  REVIEW OF SYSTEMS/PHYSICAL EXAMINATION:  Just performed within the past two days at [name] medical floor.  There were no significant findings.  CURRENT MENTAL STATUS EXAMINATION:  The patient is a casually dressed moderately overweight middle-aged white female.  Speech is decreased in quantity and rather soft.  She displays some psychomotor slowing.  Thought processes show auditory hallucinations and suicide ideation prior to admission.  She is denying current suicidal ideation.  Mood is depressed. Affect is sad and quite blunted.  Oriented x 3.  Fund of information, simple calculations, memory are intact.  ADMITTING DIAGNOSES: Axis I:    Schizoaffective disorder. Axis II:   No disorder. Axis III:  1. Nausea and vomiting of unknown etiology.            2.  History of back pain. Axis IV:   Psychosocial stressors moderate. Axis V:    Global assessment of functioning current is 45; highest in            the past year is 65.  CURRENT TREATMENT PLAN AND RECOMMENDATION:  The patient will be tried on Anafranil as I feel that she needs to be back on some type of antidepressant as she has been unable to tolerate other antidepressants.  We will try adding Carafate for her stomach.  She will remain on the Haldol and Cogentin. DD:  08/22/00 TD:  08/23/00 Job: [id number]

## 2010-09-11 NOTE — Procedures (Signed)
HISTORY:  This is a 50 year old with a history of dizziness, episodes of  slurred speech, diagnosed with schizophrenia in the past.  The patient  is being evaluated for the above events.  This is a routine EEG.  No  skull defects noted.   Medications include Clozaril, trazodone, Lamictal, Trilafon.   EEG CLASSIFICATION:  Dysrhythmia grade 1 generalized.   The background rhythms of this recording consists of 7-8 Hz background  activity that is well modulated and reactive to eye open and closure.  As the record progresses, photic stimulation is performed resulting in a  bilateral and symmetric photic drive response.  Hyperventilation is also  performed resulting in a minimal buildup of background rhythm activity  without significant slowing seen.  At no time during recording does  there appear to definite of spike, spike wave discharges or evidence of  focal slowing.  EKG monitor shows no evidence of cardiac rhythm  abnormalities and a heart rate of 90.   IMPRESSION:  This is a mildly abnormal EEG recording due to diffuse  background slowing seen.  Such recording is nonspecific and can be seen  with any process results in a toxic or metabolic encephalopathy or any  dementing type illness.  No epileptiform discharges were seen at any  time.      Marlan Palau, M.D.  Electronically Signed     [id number]:[id number]  D:  06/07/2006 18:18:30  T:  06/08/2006 09:13:32  Job #:  [id number]

## 2010-09-11 NOTE — Op Note (Signed)
NAME:  [name], [name]                ACCOUNT NO.:  [id number]   MEDICAL RECORD NO.:  [id number]          PATIENT TYPE:  AMB   LOCATION:  ENDO                         FACILITY:  [facility]   PHYSICIAN:  Anselmo Rod, M.D.  DATE OF BIRTH:  1961-03-19   DATE OF PROCEDURE:  09/17/2005  DATE OF DISCHARGE:  09/17/2005                                 OPERATIVE REPORT   PROCEDURES PERFORMED:  Esophagogastroduodenoscopy with antral biopsies.   ENDOSCOPIST:  Anselmo Rod, M.D.   INSTRUMENT USED:  Olympus video panendoscope.   INDICATIONS FOR PROCEDURE:  The patient is a 50 year old female with a  history of reflux and bulimia, developed dysphagia in the recent past,  undergoing EGD to rule out peptic esophagitis, stricture, etc.   PREPROCEDURE PREPARATION:  Informed consent was procured from the patient.  The patient fasted for eight hours prior to procedure.  Risks and benefits  of the procedure were discussed with the patient in great deal.   BRIEF HISTORY AND PHYSICAL:  The patient had stable vital signs.  The neck  is supple.  Chest clear to auscultation.  S1, S2, regular.  Abdomen soft  with normal bowel sounds.   DESCRIPTION OF PROCEDURE:  The patient was placed in left lateral decubitus  position, sedated with 50 micrograms of fentanyl and 5 mg of Versed, in  slow, incremental doses.  Once the patient was adequately sedated and  maintained on low-flow oxygen and continuous cardiac monitoring, the Olympus  video panendoscope was advanced with a mouth piece over the tongue, into the  esophagus, under direct vision.  The entire esophagus appeared normal with  no evidence of ring, stricture, mass, esophagitis or Barrett's mucosa.  The  scope was then advanced into the stomach.  A small hiatal hernia was seen on  high retroflexion.  There was diffuse gastritis noted throughout the gastric  mucosa.  A small gastric polyp was seen in the proximal stomach, as well.  This was biopsied,  along with other gastric biopsies, taken to rule out H.  pylori.  The proximal small bowel appeared normal.  There was no evidence of  an ulcer.  The patient tolerated the procedure well, without immediate  complications.   IMPRESSION:  1.  Normal-appearing esophagus, no evidence of a stricture or esophagitis.  2.  Small hiatal hernia.  3.  Diffuse gastritis with a small gastric polyp, biopsy done.  4.  No evidence of ulcer, mass, etc.  5.  Normal proximal stoma.   RECOMMENDATIONS:  1.  Continue proton pump inhibitors.  2.  Follow anti-reflux measures.  3.  Avoid nonsteroidals for now.  4.  Await pathology results.  Treat with antibiotics if H. pylori present on      biopsies.  5.  Outpatient followup in the next two weeks, earlier if need be.      Anselmo Rod, M.D.  Electronically Signed     JNM/MEDQ  D:  09/20/2005  T:  09/20/2005  Job:  [id number]   cc:   Renaye Rakers, M.D.  Fax: [phone]

## 2010-09-11 NOTE — Procedures (Signed)
[place]  Patient:    [name], [name]                         MRN: [id number] Proc. Date: 05/08/00 Adm. Date:  [id number] Attending:  Lorre Nick                           Procedure Report  PREOPERATIVE DIAGNOSIS: 1. Persistent vomiting. 2. Duodenitis.  POSTOPERATIVE DIAGNOSIS: 1. Grade 1 esophagitis. 2. Hiatal hernia that was noted.  PROCEDURE PERFORMED:  Esophagogastroduodenoscopy.  MEDICATIONS USED:  Demerol 50 mg IV, Versed 7.5 mg IV over a 10-minute period of time.  INSTRUMENT USED:  Olympus video panendoscope.  ENDOSCOPIST:  Sharyn Dross., M.D.  INFORMED CONSENT:  The patient was advised of the procedure, indications, and the risks involved.  The patient has agreed to have the procedure performed at this time.  Due to the nature of her persistent vomiting which has been ongoing for the last several days with evidence of bleeding that was present with one of the episodes of vomiting that was noted she has agreed to have this procedure performed.  PREOPERATIVE PREPARATION:  The patient was brought to the emergency department where the patient was in a supine position.  An IV for IV conscious sedating medication was started.  A monitor was placed on the patient to monitor the patients vital signs and oxygen saturation.  Nasal oxygen at about 3L per minute was used and after adequate sedation was performed, the procedure was begun.  DESCRIPTION OF PROCEDURE:  The instrument was advanced with the patient in the left lateral position via the direct technique without difficulty.  The oropharyngeal epliglottis, vocal cords and piriform sinuses appeared to be grossly within normal limits.  The esophagus was normal without any evidence of acute inflammation, ulcerations, hiatal hernias or varices appreciated initially.  The gastric area showed a normal mucous lake without any evidence of acute inflammation or ulcerations that was  noted.  The antral area appeared to be grossly within normal limits and the pylorus was normal that was appreciated. Upon advancement through the pyloric canal, the duodenal bulb and second portion appeared to be unremarkable.  The instrument was retracted back where a retroflex view of the cardia showed evidence consistent with an hiatal hernia that was present with evidence of reddish spots that was appreciated. Photographs were taken of the region.  The instrument was gradually retracted back where the reddish spots appeared to show evidence of focal inflammation present in the cardia region that was present at this time. The Z-line appeared to be approximately 34 cm distal esophagus that was noted. As the instrument was retracted back into the esophageal region, there appeared to be evidence of a grade 1 esophagitis that was noted.  The instrument was subsequently removed per orum without difficulty with the patient tolerating the procedure well.  TREATMENT:  I am going to presently start the patient with Prevacid 30 mg p.o. q.d. at this time.  I am going to also have the patient get an injection of Phenergan IM now and also Phenergan suppositories to take t.i.d. p.r.n. for persistent nausea and vomiting.  She is to follow up with me in three days and for repeat two-hour postprandial glucose level (present one is 84) as well as a repeat amylase level at that time.  If the vomiting persists during the course of the day, she will  be admitted into the hospital for treatment.  Will have the patient  avoid any fatty foods that was present at this time. DD:  05/08/00 TD:  05/08/00 Job: [id number]

## 2011-05-17 DIAGNOSIS — Z79899 Other long term (current) drug therapy: Secondary | ICD-10-CM | POA: Diagnosis not present

## 2011-05-18 DIAGNOSIS — F333 Major depressive disorder, recurrent, severe with psychotic symptoms: Secondary | ICD-10-CM | POA: Diagnosis not present

## 2011-05-18 DIAGNOSIS — F259 Schizoaffective disorder, unspecified: Secondary | ICD-10-CM | POA: Diagnosis not present

## 2011-05-25 DIAGNOSIS — F333 Major depressive disorder, recurrent, severe with psychotic symptoms: Secondary | ICD-10-CM | POA: Diagnosis not present

## 2011-05-25 DIAGNOSIS — F259 Schizoaffective disorder, unspecified: Secondary | ICD-10-CM | POA: Diagnosis not present

## 2011-05-31 DIAGNOSIS — F259 Schizoaffective disorder, unspecified: Secondary | ICD-10-CM | POA: Diagnosis not present

## 2011-05-31 DIAGNOSIS — F502 Bulimia nervosa: Secondary | ICD-10-CM | POA: Diagnosis not present

## 2011-06-02 DIAGNOSIS — F259 Schizoaffective disorder, unspecified: Secondary | ICD-10-CM | POA: Diagnosis not present

## 2011-06-09 DIAGNOSIS — F259 Schizoaffective disorder, unspecified: Secondary | ICD-10-CM | POA: Diagnosis not present

## 2011-06-14 DIAGNOSIS — Z79899 Other long term (current) drug therapy: Secondary | ICD-10-CM | POA: Diagnosis not present

## 2011-06-16 DIAGNOSIS — F259 Schizoaffective disorder, unspecified: Secondary | ICD-10-CM | POA: Diagnosis not present

## 2011-06-23 DIAGNOSIS — F259 Schizoaffective disorder, unspecified: Secondary | ICD-10-CM | POA: Diagnosis not present

## 2011-06-23 DIAGNOSIS — F333 Major depressive disorder, recurrent, severe with psychotic symptoms: Secondary | ICD-10-CM | POA: Diagnosis not present

## 2011-06-23 DIAGNOSIS — F502 Bulimia nervosa: Secondary | ICD-10-CM | POA: Diagnosis not present

## 2011-06-30 DIAGNOSIS — F259 Schizoaffective disorder, unspecified: Secondary | ICD-10-CM | POA: Diagnosis not present

## 2011-06-30 DIAGNOSIS — F502 Bulimia nervosa: Secondary | ICD-10-CM | POA: Diagnosis not present

## 2011-06-30 DIAGNOSIS — F333 Major depressive disorder, recurrent, severe with psychotic symptoms: Secondary | ICD-10-CM | POA: Diagnosis not present

## 2011-07-09 DIAGNOSIS — F259 Schizoaffective disorder, unspecified: Secondary | ICD-10-CM | POA: Diagnosis not present

## 2011-07-09 DIAGNOSIS — F333 Major depressive disorder, recurrent, severe with psychotic symptoms: Secondary | ICD-10-CM | POA: Diagnosis not present

## 2011-07-09 DIAGNOSIS — F502 Bulimia nervosa: Secondary | ICD-10-CM | POA: Diagnosis not present

## 2011-07-14 DIAGNOSIS — F259 Schizoaffective disorder, unspecified: Secondary | ICD-10-CM | POA: Diagnosis not present

## 2011-07-15 DIAGNOSIS — F502 Bulimia nervosa: Secondary | ICD-10-CM | POA: Diagnosis not present

## 2011-07-15 DIAGNOSIS — F333 Major depressive disorder, recurrent, severe with psychotic symptoms: Secondary | ICD-10-CM | POA: Diagnosis not present

## 2011-07-15 DIAGNOSIS — F259 Schizoaffective disorder, unspecified: Secondary | ICD-10-CM | POA: Diagnosis not present

## 2011-07-22 DIAGNOSIS — F259 Schizoaffective disorder, unspecified: Secondary | ICD-10-CM | POA: Diagnosis not present

## 2011-07-22 DIAGNOSIS — F333 Major depressive disorder, recurrent, severe with psychotic symptoms: Secondary | ICD-10-CM | POA: Diagnosis not present

## 2011-07-22 DIAGNOSIS — F502 Bulimia nervosa: Secondary | ICD-10-CM | POA: Diagnosis not present

## 2011-07-27 DIAGNOSIS — F502 Bulimia nervosa: Secondary | ICD-10-CM | POA: Diagnosis not present

## 2011-07-27 DIAGNOSIS — F259 Schizoaffective disorder, unspecified: Secondary | ICD-10-CM | POA: Diagnosis not present

## 2011-07-29 DIAGNOSIS — F259 Schizoaffective disorder, unspecified: Secondary | ICD-10-CM | POA: Diagnosis not present

## 2011-07-29 DIAGNOSIS — F333 Major depressive disorder, recurrent, severe with psychotic symptoms: Secondary | ICD-10-CM | POA: Diagnosis not present

## 2011-07-29 DIAGNOSIS — F502 Bulimia nervosa: Secondary | ICD-10-CM | POA: Diagnosis not present

## 2011-08-05 DIAGNOSIS — F259 Schizoaffective disorder, unspecified: Secondary | ICD-10-CM | POA: Diagnosis not present

## 2011-08-05 DIAGNOSIS — F333 Major depressive disorder, recurrent, severe with psychotic symptoms: Secondary | ICD-10-CM | POA: Diagnosis not present

## 2011-08-11 DIAGNOSIS — Z79899 Other long term (current) drug therapy: Secondary | ICD-10-CM | POA: Diagnosis not present

## 2011-08-12 DIAGNOSIS — F333 Major depressive disorder, recurrent, severe with psychotic symptoms: Secondary | ICD-10-CM | POA: Diagnosis not present

## 2011-08-12 DIAGNOSIS — F259 Schizoaffective disorder, unspecified: Secondary | ICD-10-CM | POA: Diagnosis not present

## 2011-08-19 DIAGNOSIS — F259 Schizoaffective disorder, unspecified: Secondary | ICD-10-CM | POA: Diagnosis not present

## 2011-08-19 DIAGNOSIS — F333 Major depressive disorder, recurrent, severe with psychotic symptoms: Secondary | ICD-10-CM | POA: Diagnosis not present

## 2011-08-26 DIAGNOSIS — F502 Bulimia nervosa: Secondary | ICD-10-CM | POA: Diagnosis not present

## 2011-08-26 DIAGNOSIS — F259 Schizoaffective disorder, unspecified: Secondary | ICD-10-CM | POA: Diagnosis not present

## 2011-08-26 DIAGNOSIS — F333 Major depressive disorder, recurrent, severe with psychotic symptoms: Secondary | ICD-10-CM | POA: Diagnosis not present

## 2011-09-02 DIAGNOSIS — F259 Schizoaffective disorder, unspecified: Secondary | ICD-10-CM | POA: Diagnosis not present

## 2011-09-02 DIAGNOSIS — F502 Bulimia nervosa: Secondary | ICD-10-CM | POA: Diagnosis not present

## 2011-09-02 DIAGNOSIS — F333 Major depressive disorder, recurrent, severe with psychotic symptoms: Secondary | ICD-10-CM | POA: Diagnosis not present

## 2011-09-07 DIAGNOSIS — Z79899 Other long term (current) drug therapy: Secondary | ICD-10-CM | POA: Diagnosis not present

## 2011-09-09 DIAGNOSIS — F502 Bulimia nervosa: Secondary | ICD-10-CM | POA: Diagnosis not present

## 2011-09-09 DIAGNOSIS — F333 Major depressive disorder, recurrent, severe with psychotic symptoms: Secondary | ICD-10-CM | POA: Diagnosis not present

## 2011-09-09 DIAGNOSIS — F259 Schizoaffective disorder, unspecified: Secondary | ICD-10-CM | POA: Diagnosis not present

## 2011-09-16 DIAGNOSIS — F333 Major depressive disorder, recurrent, severe with psychotic symptoms: Secondary | ICD-10-CM | POA: Diagnosis not present

## 2011-09-16 DIAGNOSIS — F502 Bulimia nervosa: Secondary | ICD-10-CM | POA: Diagnosis not present

## 2011-09-16 DIAGNOSIS — F259 Schizoaffective disorder, unspecified: Secondary | ICD-10-CM | POA: Diagnosis not present

## 2011-09-23 DIAGNOSIS — F502 Bulimia nervosa: Secondary | ICD-10-CM | POA: Diagnosis not present

## 2011-09-23 DIAGNOSIS — F259 Schizoaffective disorder, unspecified: Secondary | ICD-10-CM | POA: Diagnosis not present

## 2011-09-23 DIAGNOSIS — F333 Major depressive disorder, recurrent, severe with psychotic symptoms: Secondary | ICD-10-CM | POA: Diagnosis not present

## 2011-09-30 DIAGNOSIS — F502 Bulimia nervosa: Secondary | ICD-10-CM | POA: Diagnosis not present

## 2011-09-30 DIAGNOSIS — F259 Schizoaffective disorder, unspecified: Secondary | ICD-10-CM | POA: Diagnosis not present

## 2011-09-30 DIAGNOSIS — F333 Major depressive disorder, recurrent, severe with psychotic symptoms: Secondary | ICD-10-CM | POA: Diagnosis not present

## 2011-10-05 DIAGNOSIS — Z79899 Other long term (current) drug therapy: Secondary | ICD-10-CM | POA: Diagnosis not present

## 2011-10-14 DIAGNOSIS — F315 Bipolar disorder, current episode depressed, severe, with psychotic features: Secondary | ICD-10-CM | POA: Diagnosis not present

## 2011-10-14 DIAGNOSIS — F502 Bulimia nervosa: Secondary | ICD-10-CM | POA: Diagnosis not present

## 2011-10-14 DIAGNOSIS — F259 Schizoaffective disorder, unspecified: Secondary | ICD-10-CM | POA: Diagnosis not present

## 2011-10-21 DIAGNOSIS — F502 Bulimia nervosa: Secondary | ICD-10-CM | POA: Diagnosis not present

## 2011-10-21 DIAGNOSIS — F259 Schizoaffective disorder, unspecified: Secondary | ICD-10-CM | POA: Diagnosis not present

## 2011-10-21 DIAGNOSIS — F315 Bipolar disorder, current episode depressed, severe, with psychotic features: Secondary | ICD-10-CM | POA: Diagnosis not present

## 2011-10-22 DIAGNOSIS — J309 Allergic rhinitis, unspecified: Secondary | ICD-10-CM | POA: Diagnosis not present

## 2011-10-25 DIAGNOSIS — E781 Pure hyperglyceridemia: Secondary | ICD-10-CM | POA: Diagnosis not present

## 2011-10-25 DIAGNOSIS — M21619 Bunion of unspecified foot: Secondary | ICD-10-CM | POA: Diagnosis not present

## 2011-10-25 DIAGNOSIS — H5450 Low vision, one eye, unspecified eye: Secondary | ICD-10-CM | POA: Diagnosis not present

## 2011-10-29 DIAGNOSIS — J301 Allergic rhinitis due to pollen: Secondary | ICD-10-CM | POA: Diagnosis not present

## 2011-11-01 DIAGNOSIS — Z79899 Other long term (current) drug therapy: Secondary | ICD-10-CM | POA: Diagnosis not present

## 2011-11-04 DIAGNOSIS — F315 Bipolar disorder, current episode depressed, severe, with psychotic features: Secondary | ICD-10-CM | POA: Diagnosis not present

## 2011-11-04 DIAGNOSIS — F502 Bulimia nervosa: Secondary | ICD-10-CM | POA: Diagnosis not present

## 2011-11-04 DIAGNOSIS — F259 Schizoaffective disorder, unspecified: Secondary | ICD-10-CM | POA: Diagnosis not present

## 2011-11-11 DIAGNOSIS — F315 Bipolar disorder, current episode depressed, severe, with psychotic features: Secondary | ICD-10-CM | POA: Diagnosis not present

## 2011-11-11 DIAGNOSIS — F502 Bulimia nervosa: Secondary | ICD-10-CM | POA: Diagnosis not present

## 2011-11-11 DIAGNOSIS — F259 Schizoaffective disorder, unspecified: Secondary | ICD-10-CM | POA: Diagnosis not present

## 2011-11-18 DIAGNOSIS — F502 Bulimia nervosa: Secondary | ICD-10-CM | POA: Diagnosis not present

## 2011-11-18 DIAGNOSIS — F259 Schizoaffective disorder, unspecified: Secondary | ICD-10-CM | POA: Diagnosis not present

## 2011-11-18 DIAGNOSIS — F315 Bipolar disorder, current episode depressed, severe, with psychotic features: Secondary | ICD-10-CM | POA: Diagnosis not present

## 2011-11-29 DIAGNOSIS — Z79899 Other long term (current) drug therapy: Secondary | ICD-10-CM | POA: Diagnosis not present

## 2011-11-29 DIAGNOSIS — J301 Allergic rhinitis due to pollen: Secondary | ICD-10-CM | POA: Diagnosis not present

## 2011-12-02 DIAGNOSIS — H538 Other visual disturbances: Secondary | ICD-10-CM | POA: Diagnosis not present

## 2011-12-02 DIAGNOSIS — F502 Bulimia nervosa: Secondary | ICD-10-CM | POA: Diagnosis not present

## 2011-12-02 DIAGNOSIS — H1045 Other chronic allergic conjunctivitis: Secondary | ICD-10-CM | POA: Diagnosis not present

## 2011-12-02 DIAGNOSIS — F315 Bipolar disorder, current episode depressed, severe, with psychotic features: Secondary | ICD-10-CM | POA: Diagnosis not present

## 2011-12-02 DIAGNOSIS — F259 Schizoaffective disorder, unspecified: Secondary | ICD-10-CM | POA: Diagnosis not present

## 2011-12-16 DIAGNOSIS — F315 Bipolar disorder, current episode depressed, severe, with psychotic features: Secondary | ICD-10-CM | POA: Diagnosis not present

## 2011-12-16 DIAGNOSIS — F502 Bulimia nervosa: Secondary | ICD-10-CM | POA: Diagnosis not present

## 2011-12-16 DIAGNOSIS — F259 Schizoaffective disorder, unspecified: Secondary | ICD-10-CM | POA: Diagnosis not present

## 2011-12-28 DIAGNOSIS — Z79899 Other long term (current) drug therapy: Secondary | ICD-10-CM | POA: Diagnosis not present

## 2012-01-06 DIAGNOSIS — F502 Bulimia nervosa: Secondary | ICD-10-CM | POA: Diagnosis not present

## 2012-01-06 DIAGNOSIS — F259 Schizoaffective disorder, unspecified: Secondary | ICD-10-CM | POA: Diagnosis not present

## 2012-01-06 DIAGNOSIS — F315 Bipolar disorder, current episode depressed, severe, with psychotic features: Secondary | ICD-10-CM | POA: Diagnosis not present

## 2012-01-13 DIAGNOSIS — F315 Bipolar disorder, current episode depressed, severe, with psychotic features: Secondary | ICD-10-CM | POA: Diagnosis not present

## 2012-01-13 DIAGNOSIS — F259 Schizoaffective disorder, unspecified: Secondary | ICD-10-CM | POA: Diagnosis not present

## 2012-01-13 DIAGNOSIS — F502 Bulimia nervosa: Secondary | ICD-10-CM | POA: Diagnosis not present

## 2012-01-20 DIAGNOSIS — F315 Bipolar disorder, current episode depressed, severe, with psychotic features: Secondary | ICD-10-CM | POA: Diagnosis not present

## 2012-01-20 DIAGNOSIS — F502 Bulimia nervosa: Secondary | ICD-10-CM | POA: Diagnosis not present

## 2012-01-20 DIAGNOSIS — F259 Schizoaffective disorder, unspecified: Secondary | ICD-10-CM | POA: Diagnosis not present

## 2012-01-21 DIAGNOSIS — F502 Bulimia nervosa: Secondary | ICD-10-CM | POA: Diagnosis not present

## 2012-01-21 DIAGNOSIS — F259 Schizoaffective disorder, unspecified: Secondary | ICD-10-CM | POA: Diagnosis not present

## 2012-01-25 DIAGNOSIS — Z79899 Other long term (current) drug therapy: Secondary | ICD-10-CM | POA: Diagnosis not present

## 2012-02-03 DIAGNOSIS — F502 Bulimia nervosa: Secondary | ICD-10-CM | POA: Diagnosis not present

## 2012-02-03 DIAGNOSIS — F259 Schizoaffective disorder, unspecified: Secondary | ICD-10-CM | POA: Diagnosis not present

## 2012-02-03 DIAGNOSIS — F315 Bipolar disorder, current episode depressed, severe, with psychotic features: Secondary | ICD-10-CM | POA: Diagnosis not present

## 2012-02-04 DIAGNOSIS — Z79899 Other long term (current) drug therapy: Secondary | ICD-10-CM | POA: Diagnosis not present

## 2012-02-04 DIAGNOSIS — F259 Schizoaffective disorder, unspecified: Secondary | ICD-10-CM | POA: Diagnosis not present

## 2012-02-10 DIAGNOSIS — F315 Bipolar disorder, current episode depressed, severe, with psychotic features: Secondary | ICD-10-CM | POA: Diagnosis not present

## 2012-02-10 DIAGNOSIS — F502 Bulimia nervosa: Secondary | ICD-10-CM | POA: Diagnosis not present

## 2012-02-10 DIAGNOSIS — F259 Schizoaffective disorder, unspecified: Secondary | ICD-10-CM | POA: Diagnosis not present

## 2012-02-24 DIAGNOSIS — F502 Bulimia nervosa: Secondary | ICD-10-CM | POA: Diagnosis not present

## 2012-02-24 DIAGNOSIS — F315 Bipolar disorder, current episode depressed, severe, with psychotic features: Secondary | ICD-10-CM | POA: Diagnosis not present

## 2012-02-24 DIAGNOSIS — F259 Schizoaffective disorder, unspecified: Secondary | ICD-10-CM | POA: Diagnosis not present

## 2012-02-28 DIAGNOSIS — Z79899 Other long term (current) drug therapy: Secondary | ICD-10-CM | POA: Diagnosis not present

## 2012-03-07 DIAGNOSIS — F502 Bulimia nervosa: Secondary | ICD-10-CM | POA: Diagnosis not present

## 2012-03-07 DIAGNOSIS — F259 Schizoaffective disorder, unspecified: Secondary | ICD-10-CM | POA: Diagnosis not present

## 2012-03-09 DIAGNOSIS — F259 Schizoaffective disorder, unspecified: Secondary | ICD-10-CM | POA: Diagnosis not present

## 2012-03-09 DIAGNOSIS — F502 Bulimia nervosa: Secondary | ICD-10-CM | POA: Diagnosis not present

## 2012-03-09 DIAGNOSIS — F315 Bipolar disorder, current episode depressed, severe, with psychotic features: Secondary | ICD-10-CM | POA: Diagnosis not present

## 2012-03-16 DIAGNOSIS — F259 Schizoaffective disorder, unspecified: Secondary | ICD-10-CM | POA: Diagnosis not present

## 2012-03-16 DIAGNOSIS — F502 Bulimia nervosa: Secondary | ICD-10-CM | POA: Diagnosis not present

## 2012-03-16 DIAGNOSIS — F315 Bipolar disorder, current episode depressed, severe, with psychotic features: Secondary | ICD-10-CM | POA: Diagnosis not present

## 2012-03-22 DIAGNOSIS — Z79899 Other long term (current) drug therapy: Secondary | ICD-10-CM | POA: Diagnosis not present

## 2012-03-30 DIAGNOSIS — F502 Bulimia nervosa: Secondary | ICD-10-CM | POA: Diagnosis not present

## 2012-03-30 DIAGNOSIS — F315 Bipolar disorder, current episode depressed, severe, with psychotic features: Secondary | ICD-10-CM | POA: Diagnosis not present

## 2012-03-30 DIAGNOSIS — F259 Schizoaffective disorder, unspecified: Secondary | ICD-10-CM | POA: Diagnosis not present

## 2012-04-04 DIAGNOSIS — D101 Benign neoplasm of tongue: Secondary | ICD-10-CM | POA: Diagnosis not present

## 2012-04-10 DIAGNOSIS — D101 Benign neoplasm of tongue: Secondary | ICD-10-CM | POA: Diagnosis not present

## 2012-04-13 DIAGNOSIS — F502 Bulimia nervosa: Secondary | ICD-10-CM | POA: Diagnosis not present

## 2012-04-13 DIAGNOSIS — F315 Bipolar disorder, current episode depressed, severe, with psychotic features: Secondary | ICD-10-CM | POA: Diagnosis not present

## 2012-04-13 DIAGNOSIS — F259 Schizoaffective disorder, unspecified: Secondary | ICD-10-CM | POA: Diagnosis not present

## 2012-04-17 DIAGNOSIS — Z79899 Other long term (current) drug therapy: Secondary | ICD-10-CM | POA: Diagnosis not present

## 2012-04-24 DIAGNOSIS — Z79899 Other long term (current) drug therapy: Secondary | ICD-10-CM | POA: Diagnosis not present

## 2012-05-02 DIAGNOSIS — F502 Bulimia nervosa: Secondary | ICD-10-CM | POA: Diagnosis not present

## 2012-05-02 DIAGNOSIS — F259 Schizoaffective disorder, unspecified: Secondary | ICD-10-CM | POA: Diagnosis not present

## 2012-05-04 DIAGNOSIS — F315 Bipolar disorder, current episode depressed, severe, with psychotic features: Secondary | ICD-10-CM | POA: Diagnosis not present

## 2012-05-04 DIAGNOSIS — F502 Bulimia nervosa: Secondary | ICD-10-CM | POA: Diagnosis not present

## 2012-05-04 DIAGNOSIS — F259 Schizoaffective disorder, unspecified: Secondary | ICD-10-CM | POA: Diagnosis not present

## 2012-05-11 DIAGNOSIS — F259 Schizoaffective disorder, unspecified: Secondary | ICD-10-CM | POA: Diagnosis not present

## 2012-05-11 DIAGNOSIS — F502 Bulimia nervosa: Secondary | ICD-10-CM | POA: Diagnosis not present

## 2012-05-11 DIAGNOSIS — F315 Bipolar disorder, current episode depressed, severe, with psychotic features: Secondary | ICD-10-CM | POA: Diagnosis not present

## 2012-05-18 DIAGNOSIS — F502 Bulimia nervosa: Secondary | ICD-10-CM | POA: Diagnosis not present

## 2012-05-18 DIAGNOSIS — F259 Schizoaffective disorder, unspecified: Secondary | ICD-10-CM | POA: Diagnosis not present

## 2012-05-18 DIAGNOSIS — F315 Bipolar disorder, current episode depressed, severe, with psychotic features: Secondary | ICD-10-CM | POA: Diagnosis not present

## 2012-05-22 DIAGNOSIS — Z79899 Other long term (current) drug therapy: Secondary | ICD-10-CM | POA: Diagnosis not present

## 2012-05-25 DIAGNOSIS — F315 Bipolar disorder, current episode depressed, severe, with psychotic features: Secondary | ICD-10-CM | POA: Diagnosis not present

## 2012-05-25 DIAGNOSIS — F259 Schizoaffective disorder, unspecified: Secondary | ICD-10-CM | POA: Diagnosis not present

## 2012-05-25 DIAGNOSIS — F502 Bulimia nervosa: Secondary | ICD-10-CM | POA: Diagnosis not present

## 2012-06-01 DIAGNOSIS — F315 Bipolar disorder, current episode depressed, severe, with psychotic features: Secondary | ICD-10-CM | POA: Diagnosis not present

## 2012-06-01 DIAGNOSIS — F502 Bulimia nervosa: Secondary | ICD-10-CM | POA: Diagnosis not present

## 2012-06-01 DIAGNOSIS — F259 Schizoaffective disorder, unspecified: Secondary | ICD-10-CM | POA: Diagnosis not present

## 2012-06-15 DIAGNOSIS — F502 Bulimia nervosa: Secondary | ICD-10-CM | POA: Diagnosis not present

## 2012-06-15 DIAGNOSIS — F315 Bipolar disorder, current episode depressed, severe, with psychotic features: Secondary | ICD-10-CM | POA: Diagnosis not present

## 2012-06-15 DIAGNOSIS — F259 Schizoaffective disorder, unspecified: Secondary | ICD-10-CM | POA: Diagnosis not present

## 2012-06-19 DIAGNOSIS — Z79899 Other long term (current) drug therapy: Secondary | ICD-10-CM | POA: Diagnosis not present

## 2012-06-22 DIAGNOSIS — F259 Schizoaffective disorder, unspecified: Secondary | ICD-10-CM | POA: Diagnosis not present

## 2012-06-22 DIAGNOSIS — F315 Bipolar disorder, current episode depressed, severe, with psychotic features: Secondary | ICD-10-CM | POA: Diagnosis not present

## 2012-06-22 DIAGNOSIS — F502 Bulimia nervosa: Secondary | ICD-10-CM | POA: Diagnosis not present

## 2012-06-29 DIAGNOSIS — F315 Bipolar disorder, current episode depressed, severe, with psychotic features: Secondary | ICD-10-CM | POA: Diagnosis not present

## 2012-06-29 DIAGNOSIS — F259 Schizoaffective disorder, unspecified: Secondary | ICD-10-CM | POA: Diagnosis not present

## 2012-07-06 DIAGNOSIS — F315 Bipolar disorder, current episode depressed, severe, with psychotic features: Secondary | ICD-10-CM | POA: Diagnosis not present

## 2012-07-06 DIAGNOSIS — F259 Schizoaffective disorder, unspecified: Secondary | ICD-10-CM | POA: Diagnosis not present

## 2012-07-13 DIAGNOSIS — F315 Bipolar disorder, current episode depressed, severe, with psychotic features: Secondary | ICD-10-CM | POA: Diagnosis not present

## 2012-07-13 DIAGNOSIS — F259 Schizoaffective disorder, unspecified: Secondary | ICD-10-CM | POA: Diagnosis not present

## 2012-07-14 DIAGNOSIS — M940 Chondrocostal junction syndrome [Tietze]: Secondary | ICD-10-CM | POA: Diagnosis not present

## 2012-07-14 DIAGNOSIS — K219 Gastro-esophageal reflux disease without esophagitis: Secondary | ICD-10-CM | POA: Diagnosis not present

## 2012-07-14 DIAGNOSIS — E119 Type 2 diabetes mellitus without complications: Secondary | ICD-10-CM | POA: Diagnosis not present

## 2012-07-17 DIAGNOSIS — Z79899 Other long term (current) drug therapy: Secondary | ICD-10-CM | POA: Diagnosis not present

## 2012-07-20 DIAGNOSIS — F315 Bipolar disorder, current episode depressed, severe, with psychotic features: Secondary | ICD-10-CM | POA: Diagnosis not present

## 2012-07-20 DIAGNOSIS — F259 Schizoaffective disorder, unspecified: Secondary | ICD-10-CM | POA: Diagnosis not present

## 2012-08-01 DIAGNOSIS — F315 Bipolar disorder, current episode depressed, severe, with psychotic features: Secondary | ICD-10-CM | POA: Diagnosis not present

## 2012-08-01 DIAGNOSIS — F259 Schizoaffective disorder, unspecified: Secondary | ICD-10-CM | POA: Diagnosis not present

## 2012-08-10 DIAGNOSIS — F315 Bipolar disorder, current episode depressed, severe, with psychotic features: Secondary | ICD-10-CM | POA: Diagnosis not present

## 2012-08-10 DIAGNOSIS — F259 Schizoaffective disorder, unspecified: Secondary | ICD-10-CM | POA: Diagnosis not present

## 2012-08-14 DIAGNOSIS — Z79899 Other long term (current) drug therapy: Secondary | ICD-10-CM | POA: Diagnosis not present

## 2012-08-17 DIAGNOSIS — F315 Bipolar disorder, current episode depressed, severe, with psychotic features: Secondary | ICD-10-CM | POA: Diagnosis not present

## 2012-08-17 DIAGNOSIS — F259 Schizoaffective disorder, unspecified: Secondary | ICD-10-CM | POA: Diagnosis not present

## 2012-08-24 DIAGNOSIS — F259 Schizoaffective disorder, unspecified: Secondary | ICD-10-CM | POA: Diagnosis not present

## 2012-08-24 DIAGNOSIS — F315 Bipolar disorder, current episode depressed, severe, with psychotic features: Secondary | ICD-10-CM | POA: Diagnosis not present

## 2012-08-31 DIAGNOSIS — F315 Bipolar disorder, current episode depressed, severe, with psychotic features: Secondary | ICD-10-CM | POA: Diagnosis not present

## 2012-08-31 DIAGNOSIS — F259 Schizoaffective disorder, unspecified: Secondary | ICD-10-CM | POA: Diagnosis not present

## 2012-09-04 DIAGNOSIS — F502 Bulimia nervosa: Secondary | ICD-10-CM | POA: Diagnosis not present

## 2012-09-04 DIAGNOSIS — F259 Schizoaffective disorder, unspecified: Secondary | ICD-10-CM | POA: Diagnosis not present

## 2012-09-07 DIAGNOSIS — F315 Bipolar disorder, current episode depressed, severe, with psychotic features: Secondary | ICD-10-CM | POA: Diagnosis not present

## 2012-09-07 DIAGNOSIS — F259 Schizoaffective disorder, unspecified: Secondary | ICD-10-CM | POA: Diagnosis not present

## 2012-09-11 DIAGNOSIS — Z79899 Other long term (current) drug therapy: Secondary | ICD-10-CM | POA: Diagnosis not present

## 2012-09-14 DIAGNOSIS — F315 Bipolar disorder, current episode depressed, severe, with psychotic features: Secondary | ICD-10-CM | POA: Diagnosis not present

## 2012-09-14 DIAGNOSIS — F259 Schizoaffective disorder, unspecified: Secondary | ICD-10-CM | POA: Diagnosis not present

## 2012-09-21 DIAGNOSIS — F315 Bipolar disorder, current episode depressed, severe, with psychotic features: Secondary | ICD-10-CM | POA: Diagnosis not present

## 2012-09-21 DIAGNOSIS — F259 Schizoaffective disorder, unspecified: Secondary | ICD-10-CM | POA: Diagnosis not present

## 2012-09-28 DIAGNOSIS — F259 Schizoaffective disorder, unspecified: Secondary | ICD-10-CM | POA: Diagnosis not present

## 2012-09-28 DIAGNOSIS — F315 Bipolar disorder, current episode depressed, severe, with psychotic features: Secondary | ICD-10-CM | POA: Diagnosis not present

## 2012-10-12 DIAGNOSIS — F315 Bipolar disorder, current episode depressed, severe, with psychotic features: Secondary | ICD-10-CM | POA: Diagnosis not present

## 2012-10-12 DIAGNOSIS — F259 Schizoaffective disorder, unspecified: Secondary | ICD-10-CM | POA: Diagnosis not present

## 2012-10-16 DIAGNOSIS — Z79899 Other long term (current) drug therapy: Secondary | ICD-10-CM | POA: Diagnosis not present

## 2012-10-16 DIAGNOSIS — F259 Schizoaffective disorder, unspecified: Secondary | ICD-10-CM | POA: Diagnosis not present

## 2012-10-26 DIAGNOSIS — F315 Bipolar disorder, current episode depressed, severe, with psychotic features: Secondary | ICD-10-CM | POA: Diagnosis not present

## 2012-10-26 DIAGNOSIS — F259 Schizoaffective disorder, unspecified: Secondary | ICD-10-CM | POA: Diagnosis not present

## 2012-11-02 DIAGNOSIS — F315 Bipolar disorder, current episode depressed, severe, with psychotic features: Secondary | ICD-10-CM | POA: Diagnosis not present

## 2012-11-02 DIAGNOSIS — F259 Schizoaffective disorder, unspecified: Secondary | ICD-10-CM | POA: Diagnosis not present

## 2012-11-08 DIAGNOSIS — K219 Gastro-esophageal reflux disease without esophagitis: Secondary | ICD-10-CM | POA: Diagnosis not present

## 2012-11-08 DIAGNOSIS — M1250 Traumatic arthropathy, unspecified site: Secondary | ICD-10-CM | POA: Diagnosis not present

## 2012-11-09 DIAGNOSIS — F315 Bipolar disorder, current episode depressed, severe, with psychotic features: Secondary | ICD-10-CM | POA: Diagnosis not present

## 2012-11-09 DIAGNOSIS — Z79899 Other long term (current) drug therapy: Secondary | ICD-10-CM | POA: Diagnosis not present

## 2012-11-09 DIAGNOSIS — F259 Schizoaffective disorder, unspecified: Secondary | ICD-10-CM | POA: Diagnosis not present

## 2012-11-16 DIAGNOSIS — F259 Schizoaffective disorder, unspecified: Secondary | ICD-10-CM | POA: Diagnosis not present

## 2012-11-16 DIAGNOSIS — F315 Bipolar disorder, current episode depressed, severe, with psychotic features: Secondary | ICD-10-CM | POA: Diagnosis not present

## 2012-11-21 DIAGNOSIS — Z0000 Encounter for general adult medical examination without abnormal findings: Secondary | ICD-10-CM | POA: Diagnosis not present

## 2012-11-23 DIAGNOSIS — F315 Bipolar disorder, current episode depressed, severe, with psychotic features: Secondary | ICD-10-CM | POA: Diagnosis not present

## 2012-11-23 DIAGNOSIS — F259 Schizoaffective disorder, unspecified: Secondary | ICD-10-CM | POA: Diagnosis not present

## 2012-11-24 DIAGNOSIS — F259 Schizoaffective disorder, unspecified: Secondary | ICD-10-CM | POA: Diagnosis not present

## 2012-12-06 DIAGNOSIS — Z79899 Other long term (current) drug therapy: Secondary | ICD-10-CM | POA: Diagnosis not present

## 2012-12-07 DIAGNOSIS — F315 Bipolar disorder, current episode depressed, severe, with psychotic features: Secondary | ICD-10-CM | POA: Diagnosis not present

## 2012-12-07 DIAGNOSIS — F259 Schizoaffective disorder, unspecified: Secondary | ICD-10-CM | POA: Diagnosis not present

## 2012-12-21 DIAGNOSIS — F315 Bipolar disorder, current episode depressed, severe, with psychotic features: Secondary | ICD-10-CM | POA: Diagnosis not present

## 2012-12-21 DIAGNOSIS — F259 Schizoaffective disorder, unspecified: Secondary | ICD-10-CM | POA: Diagnosis not present

## 2012-12-28 DIAGNOSIS — F315 Bipolar disorder, current episode depressed, severe, with psychotic features: Secondary | ICD-10-CM | POA: Diagnosis not present

## 2012-12-28 DIAGNOSIS — F259 Schizoaffective disorder, unspecified: Secondary | ICD-10-CM | POA: Diagnosis not present

## 2013-01-03 DIAGNOSIS — Z79899 Other long term (current) drug therapy: Secondary | ICD-10-CM | POA: Diagnosis not present

## 2013-01-04 DIAGNOSIS — F259 Schizoaffective disorder, unspecified: Secondary | ICD-10-CM | POA: Diagnosis not present

## 2013-01-04 DIAGNOSIS — F315 Bipolar disorder, current episode depressed, severe, with psychotic features: Secondary | ICD-10-CM | POA: Diagnosis not present

## 2013-01-09 ENCOUNTER — Other Ambulatory Visit: Payer: Self-pay | Admitting: Family Medicine

## 2013-01-09 ENCOUNTER — Other Ambulatory Visit (HOSPITAL_COMMUNITY)
Admission: RE | Admit: 2013-01-09 | Discharge: 2013-01-09 | Disposition: A | Discharge status: Home / Self Care | Payer: Medicare Other | Source: Ambulatory Visit | Attending: Family Medicine | Admitting: Family Medicine

## 2013-01-09 DIAGNOSIS — Z1151 Encounter for screening for human papillomavirus (HPV): Secondary | ICD-10-CM | POA: Insufficient documentation

## 2013-01-09 DIAGNOSIS — Z124 Encounter for screening for malignant neoplasm of cervix: Secondary | ICD-10-CM | POA: Diagnosis not present

## 2013-01-09 DIAGNOSIS — I739 Peripheral vascular disease, unspecified: Secondary | ICD-10-CM | POA: Diagnosis not present

## 2013-01-11 DIAGNOSIS — F315 Bipolar disorder, current episode depressed, severe, with psychotic features: Secondary | ICD-10-CM | POA: Diagnosis not present

## 2013-01-11 DIAGNOSIS — F259 Schizoaffective disorder, unspecified: Secondary | ICD-10-CM | POA: Diagnosis not present

## 2013-01-18 DIAGNOSIS — F259 Schizoaffective disorder, unspecified: Secondary | ICD-10-CM | POA: Diagnosis not present

## 2013-01-18 DIAGNOSIS — F315 Bipolar disorder, current episode depressed, severe, with psychotic features: Secondary | ICD-10-CM | POA: Diagnosis not present

## 2013-01-19 DIAGNOSIS — F259 Schizoaffective disorder, unspecified: Secondary | ICD-10-CM | POA: Diagnosis not present

## 2013-02-01 DIAGNOSIS — F259 Schizoaffective disorder, unspecified: Secondary | ICD-10-CM | POA: Diagnosis not present

## 2013-02-01 DIAGNOSIS — F315 Bipolar disorder, current episode depressed, severe, with psychotic features: Secondary | ICD-10-CM | POA: Diagnosis not present

## 2013-02-01 DIAGNOSIS — Z79899 Other long term (current) drug therapy: Secondary | ICD-10-CM | POA: Diagnosis not present

## 2013-02-06 DIAGNOSIS — N318 Other neuromuscular dysfunction of bladder: Secondary | ICD-10-CM | POA: Diagnosis not present

## 2013-02-15 DIAGNOSIS — F315 Bipolar disorder, current episode depressed, severe, with psychotic features: Secondary | ICD-10-CM | POA: Diagnosis not present

## 2013-02-15 DIAGNOSIS — F259 Schizoaffective disorder, unspecified: Secondary | ICD-10-CM | POA: Diagnosis not present

## 2013-02-28 DIAGNOSIS — Z79899 Other long term (current) drug therapy: Secondary | ICD-10-CM | POA: Diagnosis not present

## 2013-03-07 DIAGNOSIS — F315 Bipolar disorder, current episode depressed, severe, with psychotic features: Secondary | ICD-10-CM | POA: Diagnosis not present

## 2013-03-07 DIAGNOSIS — F259 Schizoaffective disorder, unspecified: Secondary | ICD-10-CM | POA: Diagnosis not present

## 2013-03-08 DIAGNOSIS — F259 Schizoaffective disorder, unspecified: Secondary | ICD-10-CM | POA: Diagnosis not present

## 2013-03-14 DIAGNOSIS — F315 Bipolar disorder, current episode depressed, severe, with psychotic features: Secondary | ICD-10-CM | POA: Diagnosis not present

## 2013-03-14 DIAGNOSIS — F259 Schizoaffective disorder, unspecified: Secondary | ICD-10-CM | POA: Diagnosis not present

## 2013-03-21 DIAGNOSIS — F259 Schizoaffective disorder, unspecified: Secondary | ICD-10-CM | POA: Diagnosis not present

## 2013-03-21 DIAGNOSIS — F315 Bipolar disorder, current episode depressed, severe, with psychotic features: Secondary | ICD-10-CM | POA: Diagnosis not present

## 2013-03-28 DIAGNOSIS — F259 Schizoaffective disorder, unspecified: Secondary | ICD-10-CM | POA: Diagnosis not present

## 2013-03-28 DIAGNOSIS — F315 Bipolar disorder, current episode depressed, severe, with psychotic features: Secondary | ICD-10-CM | POA: Diagnosis not present

## 2013-03-29 DIAGNOSIS — Z79899 Other long term (current) drug therapy: Secondary | ICD-10-CM | POA: Diagnosis not present

## 2013-04-11 DIAGNOSIS — F259 Schizoaffective disorder, unspecified: Secondary | ICD-10-CM | POA: Diagnosis not present

## 2013-04-11 DIAGNOSIS — F315 Bipolar disorder, current episode depressed, severe, with psychotic features: Secondary | ICD-10-CM | POA: Diagnosis not present

## 2013-04-24 DIAGNOSIS — Z79899 Other long term (current) drug therapy: Secondary | ICD-10-CM | POA: Diagnosis not present

## 2013-04-25 DIAGNOSIS — Z23 Encounter for immunization: Secondary | ICD-10-CM | POA: Diagnosis not present

## 2013-05-02 DIAGNOSIS — F259 Schizoaffective disorder, unspecified: Secondary | ICD-10-CM | POA: Diagnosis not present

## 2013-05-02 DIAGNOSIS — F315 Bipolar disorder, current episode depressed, severe, with psychotic features: Secondary | ICD-10-CM | POA: Diagnosis not present

## 2013-05-16 DIAGNOSIS — F315 Bipolar disorder, current episode depressed, severe, with psychotic features: Secondary | ICD-10-CM | POA: Diagnosis not present

## 2013-05-16 DIAGNOSIS — F259 Schizoaffective disorder, unspecified: Secondary | ICD-10-CM | POA: Diagnosis not present

## 2013-05-23 DIAGNOSIS — Z79899 Other long term (current) drug therapy: Secondary | ICD-10-CM | POA: Diagnosis not present

## 2013-05-30 DIAGNOSIS — F259 Schizoaffective disorder, unspecified: Secondary | ICD-10-CM | POA: Diagnosis not present

## 2013-05-30 DIAGNOSIS — F315 Bipolar disorder, current episode depressed, severe, with psychotic features: Secondary | ICD-10-CM | POA: Diagnosis not present

## 2013-05-31 DIAGNOSIS — F259 Schizoaffective disorder, unspecified: Secondary | ICD-10-CM | POA: Diagnosis not present

## 2013-06-13 DIAGNOSIS — F259 Schizoaffective disorder, unspecified: Secondary | ICD-10-CM | POA: Diagnosis not present

## 2013-06-13 DIAGNOSIS — F315 Bipolar disorder, current episode depressed, severe, with psychotic features: Secondary | ICD-10-CM | POA: Diagnosis not present

## 2013-06-20 DIAGNOSIS — Z79899 Other long term (current) drug therapy: Secondary | ICD-10-CM | POA: Diagnosis not present

## 2013-06-20 DIAGNOSIS — F259 Schizoaffective disorder, unspecified: Secondary | ICD-10-CM | POA: Diagnosis not present

## 2013-06-20 DIAGNOSIS — F315 Bipolar disorder, current episode depressed, severe, with psychotic features: Secondary | ICD-10-CM | POA: Diagnosis not present

## 2013-06-27 DIAGNOSIS — F315 Bipolar disorder, current episode depressed, severe, with psychotic features: Secondary | ICD-10-CM | POA: Diagnosis not present

## 2013-06-27 DIAGNOSIS — F259 Schizoaffective disorder, unspecified: Secondary | ICD-10-CM | POA: Diagnosis not present

## 2013-07-18 DIAGNOSIS — F259 Schizoaffective disorder, unspecified: Secondary | ICD-10-CM | POA: Diagnosis not present

## 2013-07-18 DIAGNOSIS — F315 Bipolar disorder, current episode depressed, severe, with psychotic features: Secondary | ICD-10-CM | POA: Diagnosis not present

## 2013-07-19 DIAGNOSIS — Z79899 Other long term (current) drug therapy: Secondary | ICD-10-CM | POA: Diagnosis not present

## 2013-07-25 DIAGNOSIS — H2589 Other age-related cataract: Secondary | ICD-10-CM | POA: Diagnosis not present

## 2013-08-01 DIAGNOSIS — F315 Bipolar disorder, current episode depressed, severe, with psychotic features: Secondary | ICD-10-CM | POA: Diagnosis not present

## 2013-08-01 DIAGNOSIS — F259 Schizoaffective disorder, unspecified: Secondary | ICD-10-CM | POA: Diagnosis not present

## 2013-08-08 DIAGNOSIS — F315 Bipolar disorder, current episode depressed, severe, with psychotic features: Secondary | ICD-10-CM | POA: Diagnosis not present

## 2013-08-08 DIAGNOSIS — F259 Schizoaffective disorder, unspecified: Secondary | ICD-10-CM | POA: Diagnosis not present

## 2013-08-13 DIAGNOSIS — F259 Schizoaffective disorder, unspecified: Secondary | ICD-10-CM | POA: Diagnosis not present

## 2013-08-15 DIAGNOSIS — F315 Bipolar disorder, current episode depressed, severe, with psychotic features: Secondary | ICD-10-CM | POA: Diagnosis not present

## 2013-08-15 DIAGNOSIS — F259 Schizoaffective disorder, unspecified: Secondary | ICD-10-CM | POA: Diagnosis not present

## 2013-08-20 DIAGNOSIS — Z79899 Other long term (current) drug therapy: Secondary | ICD-10-CM | POA: Diagnosis not present

## 2013-08-22 DIAGNOSIS — F259 Schizoaffective disorder, unspecified: Secondary | ICD-10-CM | POA: Diagnosis not present

## 2013-08-22 DIAGNOSIS — F315 Bipolar disorder, current episode depressed, severe, with psychotic features: Secondary | ICD-10-CM | POA: Diagnosis not present

## 2013-08-29 DIAGNOSIS — F315 Bipolar disorder, current episode depressed, severe, with psychotic features: Secondary | ICD-10-CM | POA: Diagnosis not present

## 2013-08-29 DIAGNOSIS — F259 Schizoaffective disorder, unspecified: Secondary | ICD-10-CM | POA: Diagnosis not present

## 2013-09-03 DIAGNOSIS — H2589 Other age-related cataract: Secondary | ICD-10-CM | POA: Diagnosis not present

## 2013-09-05 DIAGNOSIS — F315 Bipolar disorder, current episode depressed, severe, with psychotic features: Secondary | ICD-10-CM | POA: Diagnosis not present

## 2013-09-05 DIAGNOSIS — F259 Schizoaffective disorder, unspecified: Secondary | ICD-10-CM | POA: Diagnosis not present

## 2013-09-12 DIAGNOSIS — F315 Bipolar disorder, current episode depressed, severe, with psychotic features: Secondary | ICD-10-CM | POA: Diagnosis not present

## 2013-09-12 DIAGNOSIS — F259 Schizoaffective disorder, unspecified: Secondary | ICD-10-CM | POA: Diagnosis not present

## 2013-09-13 DIAGNOSIS — Z79899 Other long term (current) drug therapy: Secondary | ICD-10-CM | POA: Diagnosis not present

## 2013-09-19 DIAGNOSIS — Z79899 Other long term (current) drug therapy: Secondary | ICD-10-CM | POA: Diagnosis not present

## 2013-09-24 DIAGNOSIS — D72818 Other decreased white blood cell count: Secondary | ICD-10-CM | POA: Diagnosis not present

## 2013-09-25 DIAGNOSIS — Z79899 Other long term (current) drug therapy: Secondary | ICD-10-CM | POA: Diagnosis not present

## 2013-10-01 DIAGNOSIS — F315 Bipolar disorder, current episode depressed, severe, with psychotic features: Secondary | ICD-10-CM | POA: Diagnosis not present

## 2013-10-01 DIAGNOSIS — F259 Schizoaffective disorder, unspecified: Secondary | ICD-10-CM | POA: Diagnosis not present

## 2013-10-10 DIAGNOSIS — F259 Schizoaffective disorder, unspecified: Secondary | ICD-10-CM | POA: Diagnosis not present

## 2013-10-10 DIAGNOSIS — F315 Bipolar disorder, current episode depressed, severe, with psychotic features: Secondary | ICD-10-CM | POA: Diagnosis not present

## 2013-10-12 DIAGNOSIS — Z79899 Other long term (current) drug therapy: Secondary | ICD-10-CM | POA: Diagnosis not present

## 2013-10-17 DIAGNOSIS — F259 Schizoaffective disorder, unspecified: Secondary | ICD-10-CM | POA: Diagnosis not present

## 2013-10-17 DIAGNOSIS — F315 Bipolar disorder, current episode depressed, severe, with psychotic features: Secondary | ICD-10-CM | POA: Diagnosis not present

## 2013-10-24 DIAGNOSIS — F315 Bipolar disorder, current episode depressed, severe, with psychotic features: Secondary | ICD-10-CM | POA: Diagnosis not present

## 2013-10-24 DIAGNOSIS — F259 Schizoaffective disorder, unspecified: Secondary | ICD-10-CM | POA: Diagnosis not present

## 2013-10-30 DIAGNOSIS — F315 Bipolar disorder, current episode depressed, severe, with psychotic features: Secondary | ICD-10-CM | POA: Diagnosis not present

## 2013-10-30 DIAGNOSIS — F259 Schizoaffective disorder, unspecified: Secondary | ICD-10-CM | POA: Diagnosis not present

## 2013-11-01 DIAGNOSIS — F259 Schizoaffective disorder, unspecified: Secondary | ICD-10-CM | POA: Diagnosis not present

## 2013-11-07 DIAGNOSIS — F259 Schizoaffective disorder, unspecified: Secondary | ICD-10-CM | POA: Diagnosis not present

## 2013-11-07 DIAGNOSIS — F315 Bipolar disorder, current episode depressed, severe, with psychotic features: Secondary | ICD-10-CM | POA: Diagnosis not present

## 2013-11-12 DIAGNOSIS — Z79899 Other long term (current) drug therapy: Secondary | ICD-10-CM | POA: Diagnosis not present

## 2013-11-14 DIAGNOSIS — F315 Bipolar disorder, current episode depressed, severe, with psychotic features: Secondary | ICD-10-CM | POA: Diagnosis not present

## 2013-11-14 DIAGNOSIS — F259 Schizoaffective disorder, unspecified: Secondary | ICD-10-CM | POA: Diagnosis not present

## 2013-11-21 DIAGNOSIS — F315 Bipolar disorder, current episode depressed, severe, with psychotic features: Secondary | ICD-10-CM | POA: Diagnosis not present

## 2013-11-21 DIAGNOSIS — F259 Schizoaffective disorder, unspecified: Secondary | ICD-10-CM | POA: Diagnosis not present

## 2013-11-28 DIAGNOSIS — F315 Bipolar disorder, current episode depressed, severe, with psychotic features: Secondary | ICD-10-CM | POA: Diagnosis not present

## 2013-11-28 DIAGNOSIS — F259 Schizoaffective disorder, unspecified: Secondary | ICD-10-CM | POA: Diagnosis not present

## 2013-12-05 DIAGNOSIS — F315 Bipolar disorder, current episode depressed, severe, with psychotic features: Secondary | ICD-10-CM | POA: Diagnosis not present

## 2013-12-05 DIAGNOSIS — F259 Schizoaffective disorder, unspecified: Secondary | ICD-10-CM | POA: Diagnosis not present

## 2013-12-10 DIAGNOSIS — Z79899 Other long term (current) drug therapy: Secondary | ICD-10-CM | POA: Diagnosis not present

## 2013-12-12 DIAGNOSIS — F315 Bipolar disorder, current episode depressed, severe, with psychotic features: Secondary | ICD-10-CM | POA: Diagnosis not present

## 2013-12-12 DIAGNOSIS — F259 Schizoaffective disorder, unspecified: Secondary | ICD-10-CM | POA: Diagnosis not present

## 2014-01-02 DIAGNOSIS — F259 Schizoaffective disorder, unspecified: Secondary | ICD-10-CM | POA: Diagnosis not present

## 2014-01-02 DIAGNOSIS — F315 Bipolar disorder, current episode depressed, severe, with psychotic features: Secondary | ICD-10-CM | POA: Diagnosis not present

## 2014-01-07 DIAGNOSIS — Z79899 Other long term (current) drug therapy: Secondary | ICD-10-CM | POA: Diagnosis not present

## 2014-01-10 DIAGNOSIS — Z79899 Other long term (current) drug therapy: Secondary | ICD-10-CM | POA: Diagnosis not present

## 2014-01-15 DIAGNOSIS — F259 Schizoaffective disorder, unspecified: Secondary | ICD-10-CM | POA: Diagnosis not present

## 2014-01-16 DIAGNOSIS — F259 Schizoaffective disorder, unspecified: Secondary | ICD-10-CM | POA: Diagnosis not present

## 2014-01-16 DIAGNOSIS — F315 Bipolar disorder, current episode depressed, severe, with psychotic features: Secondary | ICD-10-CM | POA: Diagnosis not present

## 2014-01-22 DIAGNOSIS — E119 Type 2 diabetes mellitus without complications: Secondary | ICD-10-CM | POA: Diagnosis not present

## 2014-01-22 DIAGNOSIS — F2089 Other schizophrenia: Secondary | ICD-10-CM | POA: Diagnosis not present

## 2014-01-23 DIAGNOSIS — F315 Bipolar disorder, current episode depressed, severe, with psychotic features: Secondary | ICD-10-CM | POA: Diagnosis not present

## 2014-01-23 DIAGNOSIS — F259 Schizoaffective disorder, unspecified: Secondary | ICD-10-CM | POA: Diagnosis not present

## 2014-01-30 DIAGNOSIS — Z23 Encounter for immunization: Secondary | ICD-10-CM | POA: Diagnosis not present

## 2014-02-04 DIAGNOSIS — Z79899 Other long term (current) drug therapy: Secondary | ICD-10-CM | POA: Diagnosis not present

## 2014-02-27 DIAGNOSIS — Z69010 Encounter for mental health services for victim of parental child abuse: Secondary | ICD-10-CM | POA: Diagnosis not present

## 2014-02-27 DIAGNOSIS — F209 Schizophrenia, unspecified: Secondary | ICD-10-CM | POA: Diagnosis not present

## 2014-02-27 DIAGNOSIS — F502 Bulimia nervosa: Secondary | ICD-10-CM | POA: Diagnosis not present

## 2014-03-04 DIAGNOSIS — Z79899 Other long term (current) drug therapy: Secondary | ICD-10-CM | POA: Diagnosis not present

## 2014-03-06 DIAGNOSIS — Z69010 Encounter for mental health services for victim of parental child abuse: Secondary | ICD-10-CM | POA: Diagnosis not present

## 2014-03-06 DIAGNOSIS — F502 Bulimia nervosa: Secondary | ICD-10-CM | POA: Diagnosis not present

## 2014-03-06 DIAGNOSIS — F209 Schizophrenia, unspecified: Secondary | ICD-10-CM | POA: Diagnosis not present

## 2014-03-07 DIAGNOSIS — Z69010 Encounter for mental health services for victim of parental child abuse: Secondary | ICD-10-CM | POA: Diagnosis not present

## 2014-03-07 DIAGNOSIS — F209 Schizophrenia, unspecified: Secondary | ICD-10-CM | POA: Diagnosis not present

## 2014-03-07 DIAGNOSIS — F502 Bulimia nervosa: Secondary | ICD-10-CM | POA: Diagnosis not present

## 2014-03-13 DIAGNOSIS — F209 Schizophrenia, unspecified: Secondary | ICD-10-CM | POA: Diagnosis not present

## 2014-03-13 DIAGNOSIS — Z69010 Encounter for mental health services for victim of parental child abuse: Secondary | ICD-10-CM | POA: Diagnosis not present

## 2014-03-13 DIAGNOSIS — F502 Bulimia nervosa: Secondary | ICD-10-CM | POA: Diagnosis not present

## 2014-03-27 DIAGNOSIS — F502 Bulimia nervosa: Secondary | ICD-10-CM | POA: Diagnosis not present

## 2014-03-27 DIAGNOSIS — Z69010 Encounter for mental health services for victim of parental child abuse: Secondary | ICD-10-CM | POA: Diagnosis not present

## 2014-03-27 DIAGNOSIS — F209 Schizophrenia, unspecified: Secondary | ICD-10-CM | POA: Diagnosis not present

## 2014-03-28 DIAGNOSIS — F250 Schizoaffective disorder, bipolar type: Secondary | ICD-10-CM | POA: Diagnosis not present

## 2014-04-01 DIAGNOSIS — F502 Bulimia nervosa: Secondary | ICD-10-CM | POA: Diagnosis not present

## 2014-04-01 DIAGNOSIS — F209 Schizophrenia, unspecified: Secondary | ICD-10-CM | POA: Diagnosis not present

## 2014-04-01 DIAGNOSIS — Z69010 Encounter for mental health services for victim of parental child abuse: Secondary | ICD-10-CM | POA: Diagnosis not present

## 2014-04-09 DIAGNOSIS — F250 Schizoaffective disorder, bipolar type: Secondary | ICD-10-CM | POA: Diagnosis not present

## 2014-04-11 DIAGNOSIS — Z69010 Encounter for mental health services for victim of parental child abuse: Secondary | ICD-10-CM | POA: Diagnosis not present

## 2014-04-11 DIAGNOSIS — F502 Bulimia nervosa: Secondary | ICD-10-CM | POA: Diagnosis not present

## 2014-04-11 DIAGNOSIS — F209 Schizophrenia, unspecified: Secondary | ICD-10-CM | POA: Diagnosis not present

## 2014-04-15 DIAGNOSIS — F502 Bulimia nervosa: Secondary | ICD-10-CM | POA: Diagnosis not present

## 2014-04-15 DIAGNOSIS — Z69010 Encounter for mental health services for victim of parental child abuse: Secondary | ICD-10-CM | POA: Diagnosis not present

## 2014-04-15 DIAGNOSIS — F209 Schizophrenia, unspecified: Secondary | ICD-10-CM | POA: Diagnosis not present

## 2014-04-29 DIAGNOSIS — F250 Schizoaffective disorder, bipolar type: Secondary | ICD-10-CM | POA: Diagnosis not present

## 2014-05-01 DIAGNOSIS — F502 Bulimia nervosa: Secondary | ICD-10-CM | POA: Diagnosis not present

## 2014-05-01 DIAGNOSIS — F209 Schizophrenia, unspecified: Secondary | ICD-10-CM | POA: Diagnosis not present

## 2014-05-01 DIAGNOSIS — Z69010 Encounter for mental health services for victim of parental child abuse: Secondary | ICD-10-CM | POA: Diagnosis not present

## 2014-05-08 DIAGNOSIS — F209 Schizophrenia, unspecified: Secondary | ICD-10-CM | POA: Diagnosis not present

## 2014-05-08 DIAGNOSIS — F502 Bulimia nervosa: Secondary | ICD-10-CM | POA: Diagnosis not present

## 2014-05-08 DIAGNOSIS — Z69010 Encounter for mental health services for victim of parental child abuse: Secondary | ICD-10-CM | POA: Diagnosis not present

## 2014-05-16 DIAGNOSIS — H25041 Posterior subcapsular polar age-related cataract, right eye: Secondary | ICD-10-CM | POA: Diagnosis not present

## 2014-05-16 DIAGNOSIS — H25813 Combined forms of age-related cataract, bilateral: Secondary | ICD-10-CM | POA: Diagnosis not present

## 2014-05-16 DIAGNOSIS — H539 Unspecified visual disturbance: Secondary | ICD-10-CM | POA: Diagnosis not present

## 2014-05-22 DIAGNOSIS — F209 Schizophrenia, unspecified: Secondary | ICD-10-CM | POA: Diagnosis not present

## 2014-05-22 DIAGNOSIS — Z69010 Encounter for mental health services for victim of parental child abuse: Secondary | ICD-10-CM | POA: Diagnosis not present

## 2014-05-22 DIAGNOSIS — F502 Bulimia nervosa: Secondary | ICD-10-CM | POA: Diagnosis not present

## 2014-05-27 DIAGNOSIS — F250 Schizoaffective disorder, bipolar type: Secondary | ICD-10-CM | POA: Diagnosis not present

## 2014-05-30 DIAGNOSIS — F502 Bulimia nervosa: Secondary | ICD-10-CM | POA: Diagnosis not present

## 2014-05-30 DIAGNOSIS — F209 Schizophrenia, unspecified: Secondary | ICD-10-CM | POA: Diagnosis not present

## 2014-05-30 DIAGNOSIS — Z69010 Encounter for mental health services for victim of parental child abuse: Secondary | ICD-10-CM | POA: Diagnosis not present

## 2014-05-31 ENCOUNTER — Other Ambulatory Visit: Payer: Self-pay | Admitting: Ophthalmology

## 2014-05-31 MED ORDER — TETRACAINE HCL 0.5 % OP SOLN
1.0000 [drp] | OPHTHALMIC | Status: AC
Start: 2014-06-01 — End: 2014-06-02

## 2014-05-31 NOTE — H&P (Signed)
History & Physical:   DATE:   05-16-14  NAME:  [name], [name]     [id number]       HISTORY OF PRESENT ILLNESS: Chief Eye Complaints blurry vision  NP, DR. Ricki Miller;  DECREASE IN VA OD>OS; + FLOATERS OU; Patient c/o having a dull aching pain in OD.     HPI: EYES: Reports symptoms of INTERFERING WITH READING  LOCATION:   RIGHT EYE        QUALITY/COURSE:   Reports condition is worsening.        INTENSITY/SEVERITY:    Reports measurement ( or degree) as moderate.      DURATION:   Reports the general length of symptoms to be months.               ACTIVE PROBLEMS: Posterior subcapsular polar age-related cataract, right eye   ICD10: H25.041  ICD9:   Onset: 05/16/2014 16:30  Initial Date:    Combined forms of age-related cataract, bilateral   ICD10: H25.813  ICD9:   Onset: 05/16/2014  15:26  Initial Date:   COMPLEX POOR PUPIL DILATION Vision disturbance   ICD10: H53.10  ICD9: 368.10  Onset: 05/16/2014 15:26  Initial Date:  SURGERIES: cyst removal  REVIEW OF SYSTEMS: ROS:   GEN- Constitutional: HENT: GEN - Endocrine: Reports symptoms of LUNGS/Respiratory:  HEART/Cardiovascular: Reports symptoms of ABD/Gastrointestinal: GERD  GU  OVERACTIVE BLADDER   Musculoskeletal (BJE): NEURO/Neurological:  SCHIZOPHRENIA  PSYCH/Psychiatric:    Is the pt oriented to time, place, person? YES  Mood normal   TOBACCO: Smoker Status:   Former Research scientist (life sciences). ICD10: Z87.891 ICD9: V15.82 Onset: 05/16/2014 15:26    SOCIAL HISTORY: nocontributory  FAMILY HISTORY: Positive family history for  -   PARENTS:Stroke. (FATHER) Hypertension:  (FATHER) Heart disease (other/misc)(MOTHER) Family History - 1st Degree Relatives:  Daughter alive and well.    ALLERGIES: Ativan (Lorazepam):      PHYSICAL EXAMINATION: VS: BMI: 25.0.  BP: 117/82.  H: 65.00 in.  P: 103 /min.  W: 150lbs 0oz.    Va    OD [place] 20/200        cc 20/80+1          OS [place] 20/30         cc 20/40  EYEGLASSES: OD  +0.50 +0.50 X  092                                                                   OS   -0.50 +0.75 X 087                ADD:  +1.75  K'S   OD 43.25, 44.00         OS  43.50, 43.75  MR  OD  +0.50 +0.50 x092 20/50        OS   -0.50 +1.00 x087 20/20 ADD +1.75  VF:  OD  full in all four quadrants        OS  full in all four quadrants  Motility  FULL  PUPILS: 23mm reactive -mg  EYELIDS & OCULAR ADNEXA      SLE: Conjunctiva PIGMENT, PING  Cornea  ARCUS  anterior chamber   DEEP AND QUIET  Iris BROWN  Lens   nuclear  sclerosis  ou  PSC DENSE OD   Vitreous CLEAR   CCT  Ta   in mmHg    OD 15           OS 16 Time 05/16/2014 15:36   Gonio   Dilation phenyl 2.5% and trop 1%  Fundus:  optic nerve  OD     30% CUP, PINK                                                               OS    30% CUP, PINK   Macula      OD      CLEAR                                              OS  CLEAR  Vessels   NORMAL  Periphery  NORMAL      Exam: GENERAL: Appearance: HEAD, EARS, NOSE AND THROAT: Ears-Nose (external) Inspection: Externally, nose and ears are normal in appearance and without scars, lesions, or nodules.      Hearing assessment shows no problems with normal conversation.      LUNGS and RESPIRATORY: Lung auscultation elicits no wheezing, rhonci, rales or rubs and with equal breath sounds.    Respiratory effort described as breathing is unlabored and chest movement is symmetrical.    HEART (Cardiovascular): Heart auscultation discovers regular rate and rhythm; no murmur, gallop or rub. Normal heart sounds.    ABDOMEN (Gastrointestinal): Mass/Tenderness Exam: Neither are present.     MUSCULOSKELETAL (BJE): Inspection-Palpation: No major bone, joint, tendon, or muscle changes.      NEUROLOGICAL: Alert and oriented. No major deficits of coordination or sensation.      PSYCHIATRIC: Insight and judgment appear  both to be intact and appropriate.    Mood and affect are described as normal  mood and full affect.    SKIN: Skin Inspection: No rashes or lesions  ADMITTING DIAGNOSIS: Posterior subcapsular polar age-related cataract, right eye   ICD10: H25.041  ICD9:   Onset: 05/16/2014 16:30  Initial Date:    Combined forms of age-related cataract, bilateral   ICD10: H25.813  ICD9:   Onset: 05/16/2014  15:26  COMPLEX POOR PUPIL DILATION Vision disturbance   ICD10: H53.10  ICD9: 368.10  Onset: 05/16/2014 15:26  SURGICAL TREATMENT PLAN: phaco emulsion cataract extraction  W  intraocular lens implant  OD   Risk and benefits of surgery have been reviewed with the patient and the patient agrees to proceed with the surgical procedure.   WILL REQUIRE SPECIAL DEVICES TO DILATE PUPIL    Actions:     Handouts: New Handout, What is a cataract?.    ___________________________ Marylynn Pearson, Brooke Bonito Starter - Inactive Problems:

## 2014-06-03 ENCOUNTER — Inpatient Hospital Stay (HOSPITAL_COMMUNITY)
Admission: RE | Admit: 2014-06-03 | Discharge: 2014-06-03 | Disposition: A | Discharge status: Home / Self Care | Payer: Medicare Other | Source: Ambulatory Visit

## 2014-06-03 NOTE — Pre-Procedure Instructions (Signed)
[name]  06/03/2014   Your procedure is scheduled on:  06/05/14  Report to [facility] Admitting at 630 AM.  Call this number if you have problems the morning of surgery: [phone]   Remember:   Do not eat food or drink liquids after midnight.   Take these medicines the morning of surgery with A SIP OF WATER:    Do not wear jewelry, make-up or nail polish.  Do not wear lotions, powders, or perfumes. You may wear deodorant.  Do not shave 48 hours prior to surgery. Men may shave face and neck.  Do not bring valuables to the hospital.  [facility] is not responsible                  for any belongings or valuables.               Contacts, dentures or bridgework may not be worn into surgery.  Leave suitcase in the car. After surgery it may be brought to your room.  For patients admitted to the hospital, discharge time is determined by your                treatment team.               Patients discharged the day of surgery will not be allowed to drive  home.  Name and phone number of your driver: family  Special Instructions: Shower using CHG 2 nights before surgery and the night before surgery.  If you shower the day of surgery use CHG.  Use special wash - you have one bottle of CHG for all showers.  You should use approximately 1/3 of the bottle for each shower.   Please read over the following fact sheets that you were given: Pain Booklet, Coughing and Deep Breathing and Surgical Site Infection Prevention

## 2014-06-04 MED ORDER — GATIFLOXACIN 0.5 % OP SOLN: 1.0000 [drp] | OPHTHALMIC | Status: DC | PRN

## 2014-06-04 MED ORDER — CYCLOPENTOLATE HCL 1 % OP SOLN: 1.0000 [drp] | OPHTHALMIC | Status: DC

## 2014-06-04 MED ORDER — KETOROLAC TROMETHAMINE 0.5 % OP SOLN: 1.0000 [drp] | OPHTHALMIC | Status: DC

## 2014-06-04 MED ORDER — TROPICAMIDE 1 % OP SOLN: 1.0000 [drp] | OPHTHALMIC | Status: DC

## 2014-06-04 MED ORDER — PHENYLEPHRINE HCL 2.5 % OP SOLN: 1.0000 [drp] | OPHTHALMIC | Status: DC

## 2014-06-05 ENCOUNTER — Encounter (HOSPITAL_COMMUNITY): Admission: RE | Payer: Self-pay | Source: Ambulatory Visit

## 2014-06-05 ENCOUNTER — Ambulatory Visit (HOSPITAL_COMMUNITY): Admission: RE | Admit: 2014-06-05 | Payer: Medicare Other | Source: Ambulatory Visit | Admitting: Ophthalmology

## 2014-06-05 SURGERY — PHACOEMULSIFICATION, CATARACT, WITH IOL INSERTION
Anesthesia: Monitor Anesthesia Care | Site: Eye | Laterality: Right

## 2014-06-12 DIAGNOSIS — F209 Schizophrenia, unspecified: Secondary | ICD-10-CM | POA: Diagnosis not present

## 2014-06-12 DIAGNOSIS — F502 Bulimia nervosa: Secondary | ICD-10-CM | POA: Diagnosis not present

## 2014-06-12 DIAGNOSIS — Z69010 Encounter for mental health services for victim of parental child abuse: Secondary | ICD-10-CM | POA: Diagnosis not present

## 2014-06-21 DIAGNOSIS — K219 Gastro-esophageal reflux disease without esophagitis: Secondary | ICD-10-CM | POA: Diagnosis not present

## 2014-06-21 DIAGNOSIS — I10 Essential (primary) hypertension: Secondary | ICD-10-CM | POA: Diagnosis not present

## 2014-06-21 DIAGNOSIS — E0800 Diabetes mellitus due to underlying condition with hyperosmolarity without nonketotic hyperglycemic-hyperosmolar coma (NKHHC): Secondary | ICD-10-CM | POA: Diagnosis not present

## 2014-06-24 DIAGNOSIS — F250 Schizoaffective disorder, bipolar type: Secondary | ICD-10-CM | POA: Diagnosis not present

## 2014-06-26 DIAGNOSIS — F502 Bulimia nervosa: Secondary | ICD-10-CM | POA: Diagnosis not present

## 2014-06-26 DIAGNOSIS — Z69010 Encounter for mental health services for victim of parental child abuse: Secondary | ICD-10-CM | POA: Diagnosis not present

## 2014-06-26 DIAGNOSIS — F209 Schizophrenia, unspecified: Secondary | ICD-10-CM | POA: Diagnosis not present

## 2014-06-26 DIAGNOSIS — F250 Schizoaffective disorder, bipolar type: Secondary | ICD-10-CM | POA: Diagnosis not present

## 2014-07-03 DIAGNOSIS — Z69010 Encounter for mental health services for victim of parental child abuse: Secondary | ICD-10-CM | POA: Diagnosis not present

## 2014-07-03 DIAGNOSIS — F502 Bulimia nervosa: Secondary | ICD-10-CM | POA: Diagnosis not present

## 2014-07-03 DIAGNOSIS — F209 Schizophrenia, unspecified: Secondary | ICD-10-CM | POA: Diagnosis not present

## 2014-07-10 DIAGNOSIS — Z69010 Encounter for mental health services for victim of parental child abuse: Secondary | ICD-10-CM | POA: Diagnosis not present

## 2014-07-10 DIAGNOSIS — F502 Bulimia nervosa: Secondary | ICD-10-CM | POA: Diagnosis not present

## 2014-07-10 DIAGNOSIS — F209 Schizophrenia, unspecified: Secondary | ICD-10-CM | POA: Diagnosis not present

## 2014-07-17 DIAGNOSIS — Z69010 Encounter for mental health services for victim of parental child abuse: Secondary | ICD-10-CM | POA: Diagnosis not present

## 2014-07-17 DIAGNOSIS — F209 Schizophrenia, unspecified: Secondary | ICD-10-CM | POA: Diagnosis not present

## 2014-07-17 DIAGNOSIS — F502 Bulimia nervosa: Secondary | ICD-10-CM | POA: Diagnosis not present

## 2014-07-23 DIAGNOSIS — F250 Schizoaffective disorder, bipolar type: Secondary | ICD-10-CM | POA: Diagnosis not present

## 2014-07-24 DIAGNOSIS — F502 Bulimia nervosa: Secondary | ICD-10-CM | POA: Diagnosis not present

## 2014-07-24 DIAGNOSIS — Z69010 Encounter for mental health services for victim of parental child abuse: Secondary | ICD-10-CM | POA: Diagnosis not present

## 2014-07-24 DIAGNOSIS — F209 Schizophrenia, unspecified: Secondary | ICD-10-CM | POA: Diagnosis not present

## 2014-07-29 DIAGNOSIS — H2620 Unspecified complicated cataract: Secondary | ICD-10-CM | POA: Diagnosis not present

## 2014-07-29 DIAGNOSIS — H25041 Posterior subcapsular polar age-related cataract, right eye: Secondary | ICD-10-CM | POA: Diagnosis not present

## 2014-07-29 DIAGNOSIS — H25811 Combined forms of age-related cataract, right eye: Secondary | ICD-10-CM | POA: Diagnosis not present

## 2014-07-29 DIAGNOSIS — H21561 Pupillary abnormality, right eye: Secondary | ICD-10-CM | POA: Diagnosis not present

## 2014-07-31 DIAGNOSIS — F502 Bulimia nervosa: Secondary | ICD-10-CM | POA: Diagnosis not present

## 2014-07-31 DIAGNOSIS — Z69010 Encounter for mental health services for victim of parental child abuse: Secondary | ICD-10-CM | POA: Diagnosis not present

## 2014-07-31 DIAGNOSIS — F209 Schizophrenia, unspecified: Secondary | ICD-10-CM | POA: Diagnosis not present

## 2014-08-07 DIAGNOSIS — F502 Bulimia nervosa: Secondary | ICD-10-CM | POA: Diagnosis not present

## 2014-08-07 DIAGNOSIS — Z69010 Encounter for mental health services for victim of parental child abuse: Secondary | ICD-10-CM | POA: Diagnosis not present

## 2014-08-07 DIAGNOSIS — F209 Schizophrenia, unspecified: Secondary | ICD-10-CM | POA: Diagnosis not present

## 2014-08-14 DIAGNOSIS — Z69010 Encounter for mental health services for victim of parental child abuse: Secondary | ICD-10-CM | POA: Diagnosis not present

## 2014-08-14 DIAGNOSIS — F502 Bulimia nervosa: Secondary | ICD-10-CM | POA: Diagnosis not present

## 2014-08-14 DIAGNOSIS — F209 Schizophrenia, unspecified: Secondary | ICD-10-CM | POA: Diagnosis not present

## 2014-08-19 DIAGNOSIS — F250 Schizoaffective disorder, bipolar type: Secondary | ICD-10-CM | POA: Diagnosis not present

## 2014-08-21 DIAGNOSIS — Z69010 Encounter for mental health services for victim of parental child abuse: Secondary | ICD-10-CM | POA: Diagnosis not present

## 2014-08-21 DIAGNOSIS — F502 Bulimia nervosa: Secondary | ICD-10-CM | POA: Diagnosis not present

## 2014-08-21 DIAGNOSIS — F209 Schizophrenia, unspecified: Secondary | ICD-10-CM | POA: Diagnosis not present

## 2014-08-28 DIAGNOSIS — Z69010 Encounter for mental health services for victim of parental child abuse: Secondary | ICD-10-CM | POA: Diagnosis not present

## 2014-08-28 DIAGNOSIS — F502 Bulimia nervosa: Secondary | ICD-10-CM | POA: Diagnosis not present

## 2014-08-28 DIAGNOSIS — F209 Schizophrenia, unspecified: Secondary | ICD-10-CM | POA: Diagnosis not present

## 2014-09-04 DIAGNOSIS — F502 Bulimia nervosa: Secondary | ICD-10-CM | POA: Diagnosis not present

## 2014-09-04 DIAGNOSIS — Z69010 Encounter for mental health services for victim of parental child abuse: Secondary | ICD-10-CM | POA: Diagnosis not present

## 2014-09-04 DIAGNOSIS — F209 Schizophrenia, unspecified: Secondary | ICD-10-CM | POA: Diagnosis not present

## 2014-09-11 DIAGNOSIS — F209 Schizophrenia, unspecified: Secondary | ICD-10-CM | POA: Diagnosis not present

## 2014-09-11 DIAGNOSIS — F502 Bulimia nervosa: Secondary | ICD-10-CM | POA: Diagnosis not present

## 2014-09-11 DIAGNOSIS — Z69010 Encounter for mental health services for victim of parental child abuse: Secondary | ICD-10-CM | POA: Diagnosis not present

## 2014-09-16 DIAGNOSIS — F250 Schizoaffective disorder, bipolar type: Secondary | ICD-10-CM | POA: Diagnosis not present

## 2014-09-19 DIAGNOSIS — F250 Schizoaffective disorder, bipolar type: Secondary | ICD-10-CM | POA: Diagnosis not present

## 2014-09-25 DIAGNOSIS — F209 Schizophrenia, unspecified: Secondary | ICD-10-CM | POA: Diagnosis not present

## 2014-09-25 DIAGNOSIS — F502 Bulimia nervosa: Secondary | ICD-10-CM | POA: Diagnosis not present

## 2014-09-25 DIAGNOSIS — Z69010 Encounter for mental health services for victim of parental child abuse: Secondary | ICD-10-CM | POA: Diagnosis not present

## 2014-10-07 DIAGNOSIS — E780 Pure hypercholesterolemia: Secondary | ICD-10-CM | POA: Diagnosis not present

## 2014-10-07 DIAGNOSIS — R7309 Other abnormal glucose: Secondary | ICD-10-CM | POA: Diagnosis not present

## 2014-10-07 DIAGNOSIS — K219 Gastro-esophageal reflux disease without esophagitis: Secondary | ICD-10-CM | POA: Diagnosis not present

## 2014-10-07 DIAGNOSIS — F2089 Other schizophrenia: Secondary | ICD-10-CM | POA: Diagnosis not present

## 2014-10-09 DIAGNOSIS — F209 Schizophrenia, unspecified: Secondary | ICD-10-CM | POA: Diagnosis not present

## 2014-10-09 DIAGNOSIS — F502 Bulimia nervosa: Secondary | ICD-10-CM | POA: Diagnosis not present

## 2014-10-09 DIAGNOSIS — Z69010 Encounter for mental health services for victim of parental child abuse: Secondary | ICD-10-CM | POA: Diagnosis not present

## 2014-10-15 DIAGNOSIS — F250 Schizoaffective disorder, bipolar type: Secondary | ICD-10-CM | POA: Diagnosis not present

## 2014-10-23 DIAGNOSIS — F209 Schizophrenia, unspecified: Secondary | ICD-10-CM | POA: Diagnosis not present

## 2014-10-23 DIAGNOSIS — Z69010 Encounter for mental health services for victim of parental child abuse: Secondary | ICD-10-CM | POA: Diagnosis not present

## 2014-10-23 DIAGNOSIS — F502 Bulimia nervosa: Secondary | ICD-10-CM | POA: Diagnosis not present

## 2014-10-31 DIAGNOSIS — Z780 Asymptomatic menopausal state: Secondary | ICD-10-CM | POA: Diagnosis not present

## 2014-10-31 DIAGNOSIS — Z1231 Encounter for screening mammogram for malignant neoplasm of breast: Secondary | ICD-10-CM | POA: Diagnosis not present

## 2014-10-31 DIAGNOSIS — Z803 Family history of malignant neoplasm of breast: Secondary | ICD-10-CM | POA: Diagnosis not present

## 2014-11-01 DIAGNOSIS — F250 Schizoaffective disorder, bipolar type: Secondary | ICD-10-CM | POA: Diagnosis not present

## 2014-11-01 DIAGNOSIS — D649 Anemia, unspecified: Secondary | ICD-10-CM | POA: Diagnosis not present

## 2014-11-06 DIAGNOSIS — Z69010 Encounter for mental health services for victim of parental child abuse: Secondary | ICD-10-CM | POA: Diagnosis not present

## 2014-11-06 DIAGNOSIS — F502 Bulimia nervosa: Secondary | ICD-10-CM | POA: Diagnosis not present

## 2014-11-06 DIAGNOSIS — F209 Schizophrenia, unspecified: Secondary | ICD-10-CM | POA: Diagnosis not present

## 2014-11-12 DIAGNOSIS — F250 Schizoaffective disorder, bipolar type: Secondary | ICD-10-CM | POA: Diagnosis not present

## 2014-11-14 DIAGNOSIS — F250 Schizoaffective disorder, bipolar type: Secondary | ICD-10-CM | POA: Diagnosis not present

## 2014-11-19 DIAGNOSIS — H20021 Recurrent acute iridocyclitis, right eye: Secondary | ICD-10-CM | POA: Diagnosis not present

## 2014-11-20 DIAGNOSIS — Z69010 Encounter for mental health services for victim of parental child abuse: Secondary | ICD-10-CM | POA: Diagnosis not present

## 2014-11-20 DIAGNOSIS — F502 Bulimia nervosa: Secondary | ICD-10-CM | POA: Diagnosis not present

## 2014-11-20 DIAGNOSIS — F209 Schizophrenia, unspecified: Secondary | ICD-10-CM | POA: Diagnosis not present

## 2014-12-04 DIAGNOSIS — F502 Bulimia nervosa: Secondary | ICD-10-CM | POA: Diagnosis not present

## 2014-12-04 DIAGNOSIS — F209 Schizophrenia, unspecified: Secondary | ICD-10-CM | POA: Diagnosis not present

## 2014-12-04 DIAGNOSIS — Z69010 Encounter for mental health services for victim of parental child abuse: Secondary | ICD-10-CM | POA: Diagnosis not present

## 2014-12-05 DIAGNOSIS — H20021 Recurrent acute iridocyclitis, right eye: Secondary | ICD-10-CM | POA: Diagnosis not present

## 2014-12-09 DIAGNOSIS — F250 Schizoaffective disorder, bipolar type: Secondary | ICD-10-CM | POA: Diagnosis not present

## 2014-12-11 DIAGNOSIS — Z69010 Encounter for mental health services for victim of parental child abuse: Secondary | ICD-10-CM | POA: Diagnosis not present

## 2014-12-11 DIAGNOSIS — F502 Bulimia nervosa: Secondary | ICD-10-CM | POA: Diagnosis not present

## 2014-12-11 DIAGNOSIS — F209 Schizophrenia, unspecified: Secondary | ICD-10-CM | POA: Diagnosis not present

## 2014-12-17 DIAGNOSIS — F250 Schizoaffective disorder, bipolar type: Secondary | ICD-10-CM | POA: Diagnosis not present

## 2014-12-18 DIAGNOSIS — F209 Schizophrenia, unspecified: Secondary | ICD-10-CM | POA: Diagnosis not present

## 2014-12-18 DIAGNOSIS — Z69010 Encounter for mental health services for victim of parental child abuse: Secondary | ICD-10-CM | POA: Diagnosis not present

## 2014-12-18 DIAGNOSIS — F502 Bulimia nervosa: Secondary | ICD-10-CM | POA: Diagnosis not present

## 2015-01-06 DIAGNOSIS — F250 Schizoaffective disorder, bipolar type: Secondary | ICD-10-CM | POA: Diagnosis not present

## 2015-01-08 DIAGNOSIS — Z69010 Encounter for mental health services for victim of parental child abuse: Secondary | ICD-10-CM | POA: Diagnosis not present

## 2015-01-08 DIAGNOSIS — F502 Bulimia nervosa: Secondary | ICD-10-CM | POA: Diagnosis not present

## 2015-01-08 DIAGNOSIS — F209 Schizophrenia, unspecified: Secondary | ICD-10-CM | POA: Diagnosis not present

## 2015-01-09 DIAGNOSIS — R079 Chest pain, unspecified: Secondary | ICD-10-CM | POA: Diagnosis not present

## 2015-01-13 DIAGNOSIS — Z23 Encounter for immunization: Secondary | ICD-10-CM | POA: Diagnosis not present

## 2015-01-16 DIAGNOSIS — H20021 Recurrent acute iridocyclitis, right eye: Secondary | ICD-10-CM | POA: Diagnosis not present

## 2015-01-16 DIAGNOSIS — H25812 Combined forms of age-related cataract, left eye: Secondary | ICD-10-CM | POA: Diagnosis not present

## 2015-01-21 DIAGNOSIS — M270 Developmental disorders of jaws: Secondary | ICD-10-CM | POA: Diagnosis not present

## 2015-01-29 DIAGNOSIS — F209 Schizophrenia, unspecified: Secondary | ICD-10-CM | POA: Diagnosis not present

## 2015-01-29 DIAGNOSIS — F502 Bulimia nervosa: Secondary | ICD-10-CM | POA: Diagnosis not present

## 2015-01-29 DIAGNOSIS — Z69010 Encounter for mental health services for victim of parental child abuse: Secondary | ICD-10-CM | POA: Diagnosis not present

## 2015-02-03 DIAGNOSIS — F250 Schizoaffective disorder, bipolar type: Secondary | ICD-10-CM | POA: Diagnosis not present

## 2015-02-03 DIAGNOSIS — M270 Developmental disorders of jaws: Secondary | ICD-10-CM | POA: Diagnosis not present

## 2015-02-03 DIAGNOSIS — Z79899 Other long term (current) drug therapy: Secondary | ICD-10-CM | POA: Diagnosis not present

## 2015-02-12 DIAGNOSIS — F502 Bulimia nervosa: Secondary | ICD-10-CM | POA: Diagnosis not present

## 2015-02-12 DIAGNOSIS — Z69010 Encounter for mental health services for victim of parental child abuse: Secondary | ICD-10-CM | POA: Diagnosis not present

## 2015-02-12 DIAGNOSIS — F209 Schizophrenia, unspecified: Secondary | ICD-10-CM | POA: Diagnosis not present

## 2015-02-18 DIAGNOSIS — F250 Schizoaffective disorder, bipolar type: Secondary | ICD-10-CM | POA: Diagnosis not present

## 2015-02-26 DIAGNOSIS — F209 Schizophrenia, unspecified: Secondary | ICD-10-CM | POA: Diagnosis not present

## 2015-02-26 DIAGNOSIS — F502 Bulimia nervosa: Secondary | ICD-10-CM | POA: Diagnosis not present

## 2015-02-26 DIAGNOSIS — Z69010 Encounter for mental health services for victim of parental child abuse: Secondary | ICD-10-CM | POA: Diagnosis not present

## 2015-03-03 DIAGNOSIS — Z79899 Other long term (current) drug therapy: Secondary | ICD-10-CM | POA: Diagnosis not present

## 2015-03-03 DIAGNOSIS — F250 Schizoaffective disorder, bipolar type: Secondary | ICD-10-CM | POA: Diagnosis not present

## 2015-03-06 DIAGNOSIS — F250 Schizoaffective disorder, bipolar type: Secondary | ICD-10-CM | POA: Diagnosis not present

## 2015-03-06 DIAGNOSIS — Z79899 Other long term (current) drug therapy: Secondary | ICD-10-CM | POA: Diagnosis not present

## 2015-03-12 DIAGNOSIS — F502 Bulimia nervosa: Secondary | ICD-10-CM | POA: Diagnosis not present

## 2015-03-12 DIAGNOSIS — Z69010 Encounter for mental health services for victim of parental child abuse: Secondary | ICD-10-CM | POA: Diagnosis not present

## 2015-03-12 DIAGNOSIS — F209 Schizophrenia, unspecified: Secondary | ICD-10-CM | POA: Diagnosis not present

## 2015-03-26 DIAGNOSIS — F209 Schizophrenia, unspecified: Secondary | ICD-10-CM | POA: Diagnosis not present

## 2015-03-26 DIAGNOSIS — Z69010 Encounter for mental health services for victim of parental child abuse: Secondary | ICD-10-CM | POA: Diagnosis not present

## 2015-03-26 DIAGNOSIS — F502 Bulimia nervosa: Secondary | ICD-10-CM | POA: Diagnosis not present

## 2015-04-08 DIAGNOSIS — Z79899 Other long term (current) drug therapy: Secondary | ICD-10-CM | POA: Diagnosis not present

## 2015-04-08 DIAGNOSIS — F250 Schizoaffective disorder, bipolar type: Secondary | ICD-10-CM | POA: Diagnosis not present

## 2015-04-09 DIAGNOSIS — F209 Schizophrenia, unspecified: Secondary | ICD-10-CM | POA: Diagnosis not present

## 2015-04-09 DIAGNOSIS — Z69010 Encounter for mental health services for victim of parental child abuse: Secondary | ICD-10-CM | POA: Diagnosis not present

## 2015-04-09 DIAGNOSIS — F502 Bulimia nervosa: Secondary | ICD-10-CM | POA: Diagnosis not present

## 2015-04-30 DIAGNOSIS — F209 Schizophrenia, unspecified: Secondary | ICD-10-CM | POA: Diagnosis not present

## 2015-04-30 DIAGNOSIS — F502 Bulimia nervosa: Secondary | ICD-10-CM | POA: Diagnosis not present

## 2015-04-30 DIAGNOSIS — Z69010 Encounter for mental health services for victim of parental child abuse: Secondary | ICD-10-CM | POA: Diagnosis not present

## 2015-05-07 DIAGNOSIS — F250 Schizoaffective disorder, bipolar type: Secondary | ICD-10-CM | POA: Diagnosis not present

## 2015-05-07 DIAGNOSIS — Z79899 Other long term (current) drug therapy: Secondary | ICD-10-CM | POA: Diagnosis not present

## 2015-05-14 DIAGNOSIS — F502 Bulimia nervosa: Secondary | ICD-10-CM | POA: Diagnosis not present

## 2015-05-14 DIAGNOSIS — Z69010 Encounter for mental health services for victim of parental child abuse: Secondary | ICD-10-CM | POA: Diagnosis not present

## 2015-05-14 DIAGNOSIS — F209 Schizophrenia, unspecified: Secondary | ICD-10-CM | POA: Diagnosis not present

## 2015-05-19 DIAGNOSIS — F250 Schizoaffective disorder, bipolar type: Secondary | ICD-10-CM | POA: Diagnosis not present

## 2015-06-03 DIAGNOSIS — F250 Schizoaffective disorder, bipolar type: Secondary | ICD-10-CM | POA: Diagnosis not present

## 2015-06-03 DIAGNOSIS — Z79899 Other long term (current) drug therapy: Secondary | ICD-10-CM | POA: Diagnosis not present

## 2015-06-27 DIAGNOSIS — F250 Schizoaffective disorder, bipolar type: Secondary | ICD-10-CM | POA: Diagnosis not present

## 2015-06-27 DIAGNOSIS — D6489 Other specified anemias: Secondary | ICD-10-CM | POA: Diagnosis not present

## 2015-06-27 DIAGNOSIS — R799 Abnormal finding of blood chemistry, unspecified: Secondary | ICD-10-CM | POA: Diagnosis not present

## 2015-06-27 DIAGNOSIS — D510 Vitamin B12 deficiency anemia due to intrinsic factor deficiency: Secondary | ICD-10-CM | POA: Diagnosis not present

## 2015-07-01 DIAGNOSIS — F2089 Other schizophrenia: Secondary | ICD-10-CM | POA: Diagnosis not present

## 2015-07-01 DIAGNOSIS — D649 Anemia, unspecified: Secondary | ICD-10-CM | POA: Diagnosis not present

## 2015-07-02 DIAGNOSIS — F250 Schizoaffective disorder, bipolar type: Secondary | ICD-10-CM | POA: Diagnosis not present

## 2015-07-02 DIAGNOSIS — Z79899 Other long term (current) drug therapy: Secondary | ICD-10-CM | POA: Diagnosis not present

## 2015-07-16 ENCOUNTER — Encounter (HOSPITAL_COMMUNITY): Payer: Self-pay | Admitting: Emergency Medicine

## 2015-07-16 ENCOUNTER — Emergency Department (HOSPITAL_COMMUNITY)
Admission: EM | Admit: 2015-07-16 | Discharge: 2015-07-17 | Disposition: A | Discharge status: Home / Self Care | Payer: Medicare Other | Attending: Emergency Medicine | Admitting: Emergency Medicine

## 2015-07-16 DIAGNOSIS — Z8669 Personal history of other diseases of the nervous system and sense organs: Secondary | ICD-10-CM | POA: Diagnosis not present

## 2015-07-16 DIAGNOSIS — F329 Major depressive disorder, single episode, unspecified: Secondary | ICD-10-CM | POA: Diagnosis not present

## 2015-07-16 DIAGNOSIS — F259 Schizoaffective disorder, unspecified: Secondary | ICD-10-CM | POA: Insufficient documentation

## 2015-07-16 DIAGNOSIS — F419 Anxiety disorder, unspecified: Secondary | ICD-10-CM | POA: Diagnosis not present

## 2015-07-16 DIAGNOSIS — Z3202 Encounter for pregnancy test, result negative: Secondary | ICD-10-CM | POA: Diagnosis not present

## 2015-07-16 DIAGNOSIS — F17200 Nicotine dependence, unspecified, uncomplicated: Secondary | ICD-10-CM | POA: Diagnosis not present

## 2015-07-16 DIAGNOSIS — R45851 Suicidal ideations: Secondary | ICD-10-CM | POA: Diagnosis present

## 2015-07-16 DIAGNOSIS — Z79899 Other long term (current) drug therapy: Secondary | ICD-10-CM | POA: Diagnosis not present

## 2015-07-16 HISTORY — DX: Schizoaffective disorder, unspecified: F25.9

## 2015-07-16 HISTORY — DX: Unspecified cataract: H26.9

## 2015-07-16 NOTE — ED Notes (Signed)
Pt states she has schizoaffective disorder and has not had any problems for many years  Pt states her last hospitalization was 14 years ago  Pt states her daughter recently has had some problems and has been adding a great deal of stress to her life  Pt states she has began having audible hallucinations lately  Pt states she is having a hard time coping with everything going on over the past few weeks  Denies SI/HI

## 2015-07-17 DIAGNOSIS — F259 Schizoaffective disorder, unspecified: Secondary | ICD-10-CM | POA: Diagnosis not present

## 2015-07-17 DIAGNOSIS — F250 Schizoaffective disorder, bipolar type: Secondary | ICD-10-CM | POA: Diagnosis not present

## 2015-07-17 LAB — RAPID URINE DRUG SCREEN, HOSP PERFORMED
AMPHETAMINES: NOT DETECTED
BARBITURATES: NOT DETECTED
Benzodiazepines: NOT DETECTED
Cocaine: NOT DETECTED
Opiates: NOT DETECTED
TETRAHYDROCANNABINOL: NOT DETECTED

## 2015-07-17 LAB — COMPREHENSIVE METABOLIC PANEL
ALK PHOS: 46 U/L (ref 38–126)
ALT: 43 U/L (ref 14–54)
AST: 30 U/L (ref 15–41)
Albumin: 4 g/dL (ref 3.5–5.0)
Anion gap: 7 (ref 5–15)
BILIRUBIN TOTAL: 0.4 mg/dL (ref 0.3–1.2)
BUN: 18 mg/dL (ref 6–20)
CALCIUM: 9.1 mg/dL (ref 8.9–10.3)
CO2: 26 mmol/L (ref 22–32)
CREATININE: 0.93 mg/dL (ref 0.44–1.00)
Chloride: 107 mmol/L (ref 101–111)
GFR calc Af Amer: >60 mL/min (ref >60)
GLUCOSE: 106 mg/dL — AB (ref 65–99)
POTASSIUM: 3.8 mmol/L (ref 3.5–5.1)
Sodium: 140 mmol/L (ref 135–145)
Total Protein: 6.7 g/dL (ref 6.5–8.1)

## 2015-07-17 LAB — DIFFERENTIAL
BASOS PCT: 0 %
Basophils Absolute: 0 10*3/uL (ref 0.0–0.1)
EOS PCT: 0 %
Eosinophils Absolute: 0 10*3/uL (ref 0.0–0.7)
Lymphocytes Relative: 48 %
Lymphs Abs: 2.7 10*3/uL (ref 0.7–4.0)
MONO ABS: 0.4 10*3/uL (ref 0.1–1.0)
Monocytes Relative: 6 %
Neutro Abs: 2.6 10*3/uL (ref 1.7–7.7)
Neutrophils Relative %: 46 %

## 2015-07-17 LAB — CBC
HCT: 31.9 % — ABNORMAL LOW (ref 36.0–46.0)
Hemoglobin: 11.2 g/dL — ABNORMAL LOW (ref 12.0–15.0)
MCH: 31.9 pg (ref 26.0–34.0)
MCHC: 35.1 g/dL (ref 30.0–36.0)
MCV: 90.9 fL (ref 78.0–100.0)
Platelets: 213 10*3/uL (ref 150–400)
RBC: 3.51 MIL/uL — AB (ref 3.87–5.11)
RDW: 12.6 % (ref 11.5–15.5)
WBC: 6 10*3/uL (ref 4.0–10.5)

## 2015-07-17 LAB — SALICYLATE LEVEL: Salicylate Lvl: <4 mg/dL (ref 2.8–30.0)

## 2015-07-17 LAB — I-STAT BETA HCG BLOOD, ED (MC, WL, AP ONLY)

## 2015-07-17 LAB — ETHANOL

## 2015-07-17 LAB — ACETAMINOPHEN LEVEL: Acetaminophen (Tylenol), Serum: <10 ug/mL — ABNORMAL LOW (ref 10–30)

## 2015-07-17 MED ORDER — ZOLPIDEM TARTRATE 5 MG PO TABS
5.0000 mg | ORAL_TABLET | Freq: Every evening | ORAL | Status: DC | PRN
  Administered 2015-07-17: 5 mg via ORAL
  Filled 2015-07-17: qty 1

## 2015-07-17 MED ORDER — PERPHENAZINE 16 MG PO TABS
16.0000 mg | ORAL_TABLET | Freq: Every day | ORAL | Status: DC
Start: 2015-07-17 — End: 2015-07-17
  Administered 2015-07-17: 16 mg via ORAL
  Filled 2015-07-17 (×2): qty 1

## 2015-07-17 MED ORDER — IBUPROFEN 200 MG PO TABS: 600.0000 mg | ORAL_TABLET | Freq: Three times a day (TID) | ORAL | Status: DC | PRN

## 2015-07-17 MED ORDER — LAMOTRIGINE 150 MG PO TABS
150.0000 mg | ORAL_TABLET | Freq: Two times a day (BID) | ORAL | Status: DC
  Administered 2015-07-17: 150 mg via ORAL
  Filled 2015-07-17 (×3): qty 1

## 2015-07-17 MED ORDER — MELATONIN 3 MG PO TABS: 1.0000 | ORAL_TABLET | Freq: Every day | ORAL | Status: DC

## 2015-07-17 MED ORDER — ONDANSETRON HCL 4 MG PO TABS: 4.0000 mg | ORAL_TABLET | Freq: Three times a day (TID) | ORAL | Status: DC | PRN

## 2015-07-17 MED ORDER — CLOZAPINE 25 MG PO TABS
125.0000 mg | ORAL_TABLET | Freq: Every day | ORAL | Status: DC
  Filled 2015-07-17: qty 1

## 2015-07-17 NOTE — BH Assessment (Addendum)
Writer again attempted to evaluate patient. Patient was extremely drowsy but arousal. Patient awakened and asked that I wait a few minutes before asking questions. Patient sts, "You know I just woke up right"? Writer gave patient a few minutes to prepare coffee and become more alert. Writer tried to ask several questions but patient was unable to answer. Sts, "Please give me some time because my thoughts are scattered".   Writer informed nursing staff of patient's request.

## 2015-07-17 NOTE — ED Provider Notes (Signed)
Medical screening examination/treatment/procedure(s) were conducted as a shared visit with non-physician practitioner(s) and myself.  I personally evaluated the patient during the encounter.   9:43 AM Pt is well appearing. She feels better at this time. No HI or SI. She requests her morning meds at this time and agrees to follow up at [facility] today. She does not need acute psychiatric hospitalization at this time. She reports she slept well last night and feels much better at this time    Jola Schmidt, MD 07/17/15 [phone]

## 2015-07-17 NOTE — Discharge Instructions (Signed)
Schizoaffective Disorder  Schizoaffective disorder (ScAD) is a mental illness. It causes symptoms that are a mixture of schizophrenia (a psychotic disorder) and an affective (mood) disorder. The schizophrenic symptoms may include delusions, hallucinations, or odd behavior. The mood symptoms may be similar to major depression or bipolar disorder. ScAD may interfere with personal relationships or normal daily activities. People with ScAD are at increased risk for job loss, social isolation, physical health problems, anxiety and substance use disorders, and suicide.  ScAD usually occurs in cycles. Periods of severe symptoms are followed by periods of  less severe symptoms or improvement. The illness affects men and women equally but usually appears at an earlier age (teenage or early adult years) in men. People who have family members with schizophrenia, bipolar disorder, or ScAD are at higher risk of developing ScAD.  SYMPTOMS   At any one time, people with ScAD may have psychotic symptoms only or both psychotic and mood symptoms. The psychotic symptoms include one or more of the following:  · Hearing, seeing, or feeling things that are not there (hallucinations).    · Having fixed, false beliefs (delusions). The delusions usually are of being attacked, harassed, cheated, persecuted, or conspired against (paranoid delusions).  · Speaking in a way that makes no sense to others (disorganized speech).  The psychotic symptoms of ScAD may also include confusing or odd behavior or any of the negative symptoms of schizophrenia. These include loss of motivation for normal daily activities, such as bathing or grooming, withdrawal from other people, and lack of emotions.     The mood symptoms of ScAD occur more often than not. They resemble major depressive disorder or bipolar mania. Symptoms of major depression include depressed mood and four or more of the following:  · Loss of interest in usually pleasurable activities  (anhedonia).  · Sleeping more or less than normal.  · Feeling worthless or excessively guilty.  · Lack of energy or motivation.  · Trouble concentrating.  · Eating more or less than usual.  · Thinking a lot about death or suicide.  Symptoms of bipolar mania include abnormally elevated or irritable mood and increased energy or activity, plus three or more of the following:    · More confidence than normal or feeling that you are able to do anything (grandiosity).  · Feeling rested with less sleep than normal.    · Being easily distracted.    · Talking more than usual or feeling pressured to keep talking.    · Feeling that your thoughts are racing.  · Engaging in high-risk activities such as buying sprees or foolish business decisions.  DIAGNOSIS   ScAD is diagnosed through an assessment by your health care provider. Your health care provider will observe and ask questions about your thoughts, behavior, mood, and ability to function in daily life. Your health care provider may also ask questions about your medical history and use of drugs, including prescription medicines. Your health care provider may also order blood tests and imaging exams. Certain medical conditions and substances can cause symptoms that resemble ScAD. Your health care provider may refer you to a mental health specialist for evaluation.   ScAD is divided into two types. The depressive type is diagnosed if your mood symptoms are limited to major depression. The bipolar type is diagnosed if your mood symptoms are manic or a mixture of manic and depressive symptoms  TREATMENT   ScAD is usually a lifelong illness. Long-term treatment is necessary. The following treatments are available:  · Medicine. Different types of   medicine are used to treat ScAD. The exact combination depends on the type and severity of your symptoms. Antipsychotic medicine is used to control psychotic symptoms such as delusions, paranoia, and hallucinations. Mood stabilizers can  even the highs and lows of bipolar manic mood swings. Antidepressant medicines are used to treat major depressive symptoms.  · Counseling or talk therapy. Individual, group, or family counseling may be helpful in providing education, support, and guidance. Many people with ScAD also benefit from social skills and job skills (vocational) training.  A combination of medicine and counseling is usually best for managing the disorder over time. A procedure in which electricity is applied to the brain through the scalp (electroconvulsive therapy) may be used to treat people with severe manic symptoms that do not respond to medicine and counseling.  HOME CARE INSTRUCTIONS   · Take all your medicine as prescribed.  · Check with your health care provider before starting new prescription or over-the-counter medicines.  · Keep all follow up appointments with your health care provider.  SEEK MEDICAL CARE IF:   · If you are not able to take your medicines as prescribed.  · If your symptoms get worse.  SEEK IMMEDIATE MEDICAL CARE IF:   · You have serious thoughts about hurting yourself or others.     This information is not intended to replace advice given to you by your health care provider. Make sure you discuss any questions you have with your health care provider.     Document Released: 08/23/2006 Document Revised: 05/03/2014 Document Reviewed: 11/24/2012  Elsevier Interactive Patient Education ©2016 [employer]

## 2015-07-17 NOTE — ED Notes (Signed)
Pt denying thoughts of suicide or wanting to harm herself in anyway at this time. When asked "Are you having thoughts of hurting yourself right now?" Pt states, "No, I just want to get some sleep." Will continue to monitor.

## 2015-07-17 NOTE — ED Provider Notes (Signed)
CSN: [id number]     Arrival date & time 07/16/15  2126 History   First MD Initiated Contact with Patient 07/16/15 2356     Chief Complaint  Patient presents with  . Hallucinations  . Suicidal     (Consider location/radiation/quality/duration/timing/severity/associated sxs/prior Treatment) HPI Comments: This a patient with a history of schizoaffective disorder who has been doing well until recently when she had an increase in life stressor now feels suicidal she is also hearing voices that are talking about her she has no specific plan to harm herself.  The history is provided by the patient.    Past Medical History  Diagnosis Date  . Schizoaffective disorder ([place])   . Cataract    Past Surgical History  Procedure Laterality Date  . Cataract extraction    . Eye surgery    . Cyst removed from back      Family History  Problem Relation Age of Onset  . Schizophrenia Other    Social History  Substance Use Topics  . Smoking status: Current Every Day Smoker  . Smokeless tobacco: None  . Alcohol Use: No   OB History    No data available     Review of Systems  Constitutional: Negative for fever.  Respiratory: Negative for cough.   Neurological: Negative for dizziness and headaches.  Psychiatric/Behavioral: Positive for suicidal ideas. The patient is nervous/anxious.   All other systems reviewed and are negative.     Allergies  Ativan  Home Medications   Prior to Admission medications   Medication Sig Start Date End Date Taking? Authorizing Provider  calcium carbonate (OS-CAL - DOSED IN MG OF ELEMENTAL CALCIUM) 1250 (500 Ca) MG tablet Take 1 tablet by mouth daily.   Yes Historical Provider, MD  cloZAPine (CLOZARIL) 25 MG tablet Take 125 mg by mouth at bedtime.   Yes Historical Provider, MD  FERROUS FUMARATE PO Take 65 mg by mouth daily.   Yes Historical Provider, MD  glucosamine-chondroitin 500-400 MG tablet Take 1 tablet by mouth 2 (two) times daily.   Yes  Historical Provider, MD  lamoTRIgine (LAMICTAL) 150 MG tablet Take 150 mg by mouth 2 (two) times daily.   Yes Historical Provider, MD  Melatonin 3 MG TABS Take 1 tablet by mouth at bedtime.   Yes Historical Provider, MD  nicotine polacrilex (NICORETTE) 2 MG gum Take 2 mg by mouth as needed for smoking cessation.   Yes Historical Provider, MD  perphenazine (TRILAFON) 8 MG tablet Take 16 mg by mouth at bedtime.   Yes Historical Provider, MD  vitamin C (ASCORBIC ACID) 500 MG tablet Take 500 mg by mouth daily.   Yes Historical Provider, MD   BP 136/88 mmHg  Temp(Src) 98.5 F (36.9 C) (Oral)  Resp 16  SpO2 100%  LMP 07/13/2015 (Exact Date) Physical Exam  Constitutional: She appears well-developed and well-nourished.  HENT:  Head: Normocephalic.  Eyes: Pupils are equal, round, and reactive to light.  Neck: Normal range of motion.  Cardiovascular: Normal rate and regular rhythm.   Pulmonary/Chest: Effort normal.  Musculoskeletal: Normal range of motion.  Neurological: She is alert.  Skin: Skin is warm.  Psychiatric: Her speech is normal and behavior is normal. Judgment normal. Cognition and memory are normal. She exhibits a depressed mood. She expresses suicidal ideation. She expresses no suicidal plans.  Nursing note and vitals reviewed.   ED Course  Procedures (including critical care time) Labs Review Labs Reviewed  CBC - Abnormal; Notable for the following:  RBC 3.51 (*)    Hemoglobin 11.2 (*)    HCT 31.9 (*)    All other components within normal limits  COMPREHENSIVE METABOLIC PANEL  ETHANOL  SALICYLATE LEVEL  ACETAMINOPHEN LEVEL  URINE RAPID DRUG SCREEN, HOSP PERFORMED  I-STAT BETA HCG BLOOD, ED ([facility], [facility], [facility] ONLY)    Imaging Review No results found. I have personally reviewed and evaluated these images and lab results as part of my medical decision-making.   EKG Interpretation None      MDM   Final diagnoses:  None         Junius Creamer, NP 07/17/15  [id number]  Varney Biles, MD 07/17/15 [phone]

## 2015-07-17 NOTE — ED Notes (Signed)
Pt requesting something to help her sleep. PA notified.

## 2015-07-17 NOTE — BH Assessment (Signed)
[name] contacted EDP-Dr. Venora Maples and requested a TTS order. Dr. Venora Maples did place a TTS order. Writer attempted to assess patient but she was sleeping.

## 2015-07-17 NOTE — ED Notes (Signed)
Pt rested comfortably throughout my shift with her. Pt was able to get up and ambulate to the bathroom when necessary with no difficulty. No distress noted throughout shift. Respirations remained unlabored throughout sleep.

## 2015-07-17 NOTE — ED Notes (Signed)
Pt told provider that she is having suicidal ideations

## 2015-07-17 NOTE — ED Notes (Signed)
Pt first attempt to urinate was unsuccessful, pt sts she will try again later.  RN notified.  Pt has 1 personal belongings bag and one green and tan  Carry on bag.

## 2015-07-17 NOTE — BH Assessment (Signed)
Assessment Note  [name] is an 55 y.o. female. Per ED notes, pt states she has schizoaffective disorder and has not had any problems for many years Pt states her last hospitalization was 14 years ago Pt states her daughter recently has had some problems and has been adding a great deal of stress to her life Pt states she has began having audible hallucinations lately Pt states she is having a hard time coping with everything going on over the past few weeks Denies SI/HI.   Tom contacted EDP-Dr. Venora Maples and requested a TTS order. Dr. Venora Maples did place a TTS order. Writer attempted to assess patient but she was sleeping. Writer asked Tom to speak with the EDP about removing the TTS order and reordering when patient is able to be assessed.    Writer waited 10-15 minutes and again attempted to evaluate patient. Patient was extremely drowsy but arousal. Patient awakened and asked that I wait a few minutes before asking questions. Patient sts, "You know I just woke up right"? Writer gave patient a few minutes to prepare coffee and become more alert. Writer tried to ask several questions but patient was unable to answer. Sts, "Please give me some time because my thoughts are scattered". Writer informed nursing staff of patient's request. Writer was unable to determine SI, HI, and/or psychotic symptoms. Patient did not appear to be responding to internal stimuli. Patient did not report alcohol or drug use. Her UDS and BAL were both negative.   Dr. Venora Maples came to evaluate patient and decided to discharge home.     Diagnosis: Schizoaffective Disorder  Past Medical History:  Past Medical History  Diagnosis Date  . Schizoaffective disorder ([place])   . Cataract     Past Surgical History  Procedure Laterality Date  . Cataract extraction    . Eye surgery    . Cyst removed from back       Family History:  Family History  Problem Relation Age of Onset  . Schizophrenia Other     Social History:   reports that she has been smoking.  She does not have any smokeless tobacco history on file. She reports that she does not drink alcohol or use illicit drugs.  Additional Social History:  Alcohol / Drug Use Pain Medications: SEE MAR Prescriptions: SEE MAR Over the Counter: SEE MAR History of alcohol / drug use?: No history of alcohol / drug abuse  CIWA: CIWA-Ar BP: 105/74 mmHg Pulse Rate: 95 COWS:    Allergies:  Allergies  Allergen Reactions  . Ativan [Lorazepam] Other (See Comments)    Causes her to be psychotic    Home Medications:  (Not in a hospital admission)  OB/GYN Status:  Patient's last menstrual period was 07/13/2015 (exact date).  General Assessment Data Location of Assessment: [facility] TTS Assessment: In system Is this a Tele or Face-to-Face Assessment?: Face-to-Face Is this an Initial Assessment or a Re-assessment for this encounter?: Initial Assessment Marital status:  (unk) Maiden name:  (unk) Is patient pregnant?: No Pregnancy Status: No Living Arrangements: Other (Comment) Can pt return to current living arrangement?: No Admission Status: Voluntary Is patient capable of signing voluntary admission?: No Referral Source: Self/Family/Friend Insurance type:  (Medicare/Medicaid )     Crisis Care Plan Living Arrangements: Other (Comment) Legal Guardian: Other: (No legal guardian ) Name of Psychiatrist:  (No psychiatrist ) Name of Therapist:  (No therapist )  Education Status Is patient currently in school?: No Current Grade:  (n/a) Highest grade  of school patient has completed:  (n/a) Name of school:  (n/a) Contact person:  (n/a)  Risk to self with the past 6 months Suicidal Ideation:  (unk) Has patient been a risk to self within the past 6 months prior to admission? :  (unk) Suicidal Intent:  (unk) Has patient had any suicidal intent within the past 6 months prior to admission? :  (unk) Is patient at risk for suicide?:  (unk) Suicidal Plan?:   (unk) Has patient had any suicidal plan within the past 6 months prior to admission? :  (unk) Access to Means:  (unk) What has been your use of drugs/alcohol within the last 12 months?:  (unk) Previous Attempts/Gestures:  (unk) How many times?:  (unk) Other Self Harm Risks:  (unk) Triggers for Past Attempts: Unknown Intentional Self Injurious Behavior:  (unk) Family Suicide History: Unknown Recent stressful life event(s):  (unk) Persecutory voices/beliefs?:  (unk) Depression: No Depression Symptoms:  (unk) Substance abuse history and/or treatment for substance abuse?:  (unk) Suicide prevention information given to non-admitted patients: Not applicable  Risk to Others within the past 6 months Homicidal Ideation:  (unk) Does patient have any lifetime risk of violence toward others beyond the six months prior to admission? : Unknown Thoughts of Harm to Others:  (unk) Current Homicidal Intent:  (unk) Current Homicidal Plan:  (Unk) Access to Homicidal Means:  (unk) Identified Victim:  (unk) History of harm to others?:  (unk) Assessment of Violence:  (unk) Violent Behavior Description:  (patient is calm and cooperative) Does patient have access to weapons?:  (unk) Criminal Charges Pending?:  (unk) Does patient have a court date:  (unk) Is patient on probation?: Unknown  Psychosis Hallucinations:  (unk) Delusions:  (unk)  Mental Status Report Appearance/Hygiene: In scrubs, Disheveled Eye Contact: Poor Motor Activity: Unable to assess Speech: Incoherent, Soft Level of Consciousness: Drowsy Mood: Preoccupied Affect: Appropriate to circumstance Anxiety Level:  (unk) Thought Processes: Unable to Assess Judgement: Unable to Assess Orientation: Unable to assess Obsessive Compulsive Thoughts/Behaviors: Unable to Assess  Cognitive Functioning Concentration: Unable to Assess Memory: Unable to Assess Insight: Unable to Assess Impulse Control: Unable to Assess Appetite:   (unk) Weight Loss:  (unk) Weight Gain:  (unk) Sleep: Unable to Assess Total Hours of Sleep:  (unk) Vegetative Symptoms: Unable to Assess  ADLScreening [facility] Assessment Services) Patient's cognitive ability adequate to safely complete daily activities?: Yes Patient able to express need for assistance with ADLs?: Yes Independently performs ADLs?: Yes (appropriate for developmental age)  Prior Inpatient Therapy Prior Inpatient Therapy: Yes Prior Therapy Dates:  (2001-2006 (21 hospital admissions))  Prior Outpatient Therapy Prior Outpatient Therapy:  (unk) Prior Therapy Dates: unk Prior Therapy Facilty/Provider(s): unk Reason for Treatment: unk Does patient have an ACCT team?: Unknown  ADL Screening (condition at time of admission) Patient's cognitive ability adequate to safely complete daily activities?: Yes Is the patient deaf or have difficulty hearing?: No Does the patient have difficulty seeing, even when wearing glasses/contacts?: No Does the patient have difficulty concentrating, remembering, or making decisions?: No Patient able to express need for assistance with ADLs?: Yes Does the patient have difficulty dressing or bathing?: No Independently performs ADLs?: Yes (appropriate for developmental age) Does the patient have difficulty walking or climbing stairs?: No Weakness of Legs: None Weakness of Arms/Hands: None  Home Assistive Devices/Equipment Home Assistive Devices/Equipment: None    Abuse/Neglect Assessment (Assessment to be complete while patient is alone) Physical Abuse: Denies Verbal Abuse: Denies Sexual Abuse: Denies Exploitation of patient/patient's resources:  Denies Self-Neglect: Denies Values / Beliefs Cultural Requests During Hospitalization: None Spiritual Requests During Hospitalization: None   Advance Directives (For Healthcare) Does patient have an advance directive?: No Would patient like information on creating an advanced directive?: No -  patient declined information    Additional Information 1:1 In Past 12 Months?: No CIRT Risk: No Elopement Risk: No Does patient have medical clearance?: Yes     Disposition:  Disposition Initial Assessment Completed for this Encounter: Yes Disposition of Patient: Other dispositions (Discharge per Dr. Venora Maples)  On Site Evaluation by:   Reviewed with Physician:    Waldon Merl [facility] 07/17/2015 9:52 AM

## 2015-07-17 NOTE — Progress Notes (Signed)
PHARMACIST - PHYSICIAN ORDER COMMUNICATION  CONCERNING: P&T Medication Policy on Herbal Medications  DESCRIPTION:  This patient's order for:  Melatonin  has been noted.  This product(s) is classified as an "herbal" or natural product. Due to a lack of definitive safety studies or FDA approval, nonstandard manufacturing practices, plus the potential risk of unknown drug-drug interactions while on inpatient medications, the Pharmacy and Therapeutics Committee does not permit the use of "herbal" or natural products of this type within [facility].   ACTION TAKEN: The pharmacy department is unable to verify this order at this time and your patient has been informed of this safety policy. Please reevaluate patient's clinical condition at discharge and address if the herbal or natural product(s) should be resumed at that time.   Thanks Dorrene German 07/17/2015 4:03 AM

## 2015-07-23 DIAGNOSIS — F502 Bulimia nervosa: Secondary | ICD-10-CM | POA: Diagnosis not present

## 2015-07-23 DIAGNOSIS — F209 Schizophrenia, unspecified: Secondary | ICD-10-CM | POA: Diagnosis not present

## 2015-07-23 DIAGNOSIS — Z69010 Encounter for mental health services for victim of parental child abuse: Secondary | ICD-10-CM | POA: Diagnosis not present

## 2015-07-29 DIAGNOSIS — F250 Schizoaffective disorder, bipolar type: Secondary | ICD-10-CM | POA: Diagnosis not present

## 2015-07-30 DIAGNOSIS — F502 Bulimia nervosa: Secondary | ICD-10-CM | POA: Diagnosis not present

## 2015-07-30 DIAGNOSIS — Z79899 Other long term (current) drug therapy: Secondary | ICD-10-CM | POA: Diagnosis not present

## 2015-07-30 DIAGNOSIS — F209 Schizophrenia, unspecified: Secondary | ICD-10-CM | POA: Diagnosis not present

## 2015-07-30 DIAGNOSIS — F250 Schizoaffective disorder, bipolar type: Secondary | ICD-10-CM | POA: Diagnosis not present

## 2015-07-30 DIAGNOSIS — Z69010 Encounter for mental health services for victim of parental child abuse: Secondary | ICD-10-CM | POA: Diagnosis not present

## 2015-08-05 DIAGNOSIS — F209 Schizophrenia, unspecified: Secondary | ICD-10-CM | POA: Diagnosis not present

## 2015-08-05 DIAGNOSIS — F502 Bulimia nervosa: Secondary | ICD-10-CM | POA: Diagnosis not present

## 2015-08-05 DIAGNOSIS — Z69010 Encounter for mental health services for victim of parental child abuse: Secondary | ICD-10-CM | POA: Diagnosis not present

## 2015-08-13 DIAGNOSIS — F209 Schizophrenia, unspecified: Secondary | ICD-10-CM | POA: Diagnosis not present

## 2015-08-13 DIAGNOSIS — Z69010 Encounter for mental health services for victim of parental child abuse: Secondary | ICD-10-CM | POA: Diagnosis not present

## 2015-08-13 DIAGNOSIS — F502 Bulimia nervosa: Secondary | ICD-10-CM | POA: Diagnosis not present

## 2015-08-26 DIAGNOSIS — H20021 Recurrent acute iridocyclitis, right eye: Secondary | ICD-10-CM | POA: Diagnosis not present

## 2015-08-26 DIAGNOSIS — H25812 Combined forms of age-related cataract, left eye: Secondary | ICD-10-CM | POA: Diagnosis not present

## 2015-08-27 DIAGNOSIS — F250 Schizoaffective disorder, bipolar type: Secondary | ICD-10-CM | POA: Diagnosis not present

## 2015-08-27 DIAGNOSIS — Z69010 Encounter for mental health services for victim of parental child abuse: Secondary | ICD-10-CM | POA: Diagnosis not present

## 2015-08-27 DIAGNOSIS — F209 Schizophrenia, unspecified: Secondary | ICD-10-CM | POA: Diagnosis not present

## 2015-08-27 DIAGNOSIS — F502 Bulimia nervosa: Secondary | ICD-10-CM | POA: Diagnosis not present

## 2015-08-27 DIAGNOSIS — Z79899 Other long term (current) drug therapy: Secondary | ICD-10-CM | POA: Diagnosis not present

## 2015-09-03 DIAGNOSIS — F502 Bulimia nervosa: Secondary | ICD-10-CM | POA: Diagnosis not present

## 2015-09-03 DIAGNOSIS — Z69010 Encounter for mental health services for victim of parental child abuse: Secondary | ICD-10-CM | POA: Diagnosis not present

## 2015-09-03 DIAGNOSIS — F209 Schizophrenia, unspecified: Secondary | ICD-10-CM | POA: Diagnosis not present

## 2015-09-04 DIAGNOSIS — F250 Schizoaffective disorder, bipolar type: Secondary | ICD-10-CM | POA: Diagnosis not present

## 2015-09-10 DIAGNOSIS — F209 Schizophrenia, unspecified: Secondary | ICD-10-CM | POA: Diagnosis not present

## 2015-09-10 DIAGNOSIS — F502 Bulimia nervosa: Secondary | ICD-10-CM | POA: Diagnosis not present

## 2015-09-10 DIAGNOSIS — Z69010 Encounter for mental health services for victim of parental child abuse: Secondary | ICD-10-CM | POA: Diagnosis not present

## 2015-09-11 DIAGNOSIS — F250 Schizoaffective disorder, bipolar type: Secondary | ICD-10-CM | POA: Diagnosis not present

## 2015-09-16 DIAGNOSIS — F250 Schizoaffective disorder, bipolar type: Secondary | ICD-10-CM | POA: Diagnosis not present

## 2015-09-17 DIAGNOSIS — F502 Bulimia nervosa: Secondary | ICD-10-CM | POA: Diagnosis not present

## 2015-09-17 DIAGNOSIS — F209 Schizophrenia, unspecified: Secondary | ICD-10-CM | POA: Diagnosis not present

## 2015-09-17 DIAGNOSIS — Z69010 Encounter for mental health services for victim of parental child abuse: Secondary | ICD-10-CM | POA: Diagnosis not present

## 2015-09-18 DIAGNOSIS — F250 Schizoaffective disorder, bipolar type: Secondary | ICD-10-CM | POA: Diagnosis not present

## 2015-09-24 DIAGNOSIS — Z79899 Other long term (current) drug therapy: Secondary | ICD-10-CM | POA: Diagnosis not present

## 2015-09-24 DIAGNOSIS — F250 Schizoaffective disorder, bipolar type: Secondary | ICD-10-CM | POA: Diagnosis not present

## 2015-09-25 DIAGNOSIS — F250 Schizoaffective disorder, bipolar type: Secondary | ICD-10-CM | POA: Diagnosis not present

## 2015-10-02 DIAGNOSIS — F250 Schizoaffective disorder, bipolar type: Secondary | ICD-10-CM | POA: Diagnosis not present

## 2015-10-06 DIAGNOSIS — F250 Schizoaffective disorder, bipolar type: Secondary | ICD-10-CM | POA: Diagnosis not present

## 2015-10-08 DIAGNOSIS — Z69010 Encounter for mental health services for victim of parental child abuse: Secondary | ICD-10-CM | POA: Diagnosis not present

## 2015-10-08 DIAGNOSIS — F209 Schizophrenia, unspecified: Secondary | ICD-10-CM | POA: Diagnosis not present

## 2015-10-08 DIAGNOSIS — F502 Bulimia nervosa: Secondary | ICD-10-CM | POA: Diagnosis not present

## 2015-10-16 DIAGNOSIS — F250 Schizoaffective disorder, bipolar type: Secondary | ICD-10-CM | POA: Diagnosis not present

## 2015-10-20 DIAGNOSIS — F209 Schizophrenia, unspecified: Secondary | ICD-10-CM | POA: Diagnosis not present

## 2015-10-20 DIAGNOSIS — Z69010 Encounter for mental health services for victim of parental child abuse: Secondary | ICD-10-CM | POA: Diagnosis not present

## 2015-10-20 DIAGNOSIS — F502 Bulimia nervosa: Secondary | ICD-10-CM | POA: Diagnosis not present

## 2015-10-21 DIAGNOSIS — Z79899 Other long term (current) drug therapy: Secondary | ICD-10-CM | POA: Diagnosis not present

## 2015-10-21 DIAGNOSIS — F250 Schizoaffective disorder, bipolar type: Secondary | ICD-10-CM | POA: Diagnosis not present

## 2015-10-22 DIAGNOSIS — F250 Schizoaffective disorder, bipolar type: Secondary | ICD-10-CM | POA: Diagnosis not present

## 2015-10-29 DIAGNOSIS — F502 Bulimia nervosa: Secondary | ICD-10-CM | POA: Diagnosis not present

## 2015-10-29 DIAGNOSIS — Z69010 Encounter for mental health services for victim of parental child abuse: Secondary | ICD-10-CM | POA: Diagnosis not present

## 2015-10-29 DIAGNOSIS — F209 Schizophrenia, unspecified: Secondary | ICD-10-CM | POA: Diagnosis not present

## 2015-11-06 DIAGNOSIS — F250 Schizoaffective disorder, bipolar type: Secondary | ICD-10-CM | POA: Diagnosis not present

## 2015-11-18 DIAGNOSIS — F502 Bulimia nervosa: Secondary | ICD-10-CM | POA: Diagnosis not present

## 2015-11-18 DIAGNOSIS — Z69010 Encounter for mental health services for victim of parental child abuse: Secondary | ICD-10-CM | POA: Diagnosis not present

## 2015-11-18 DIAGNOSIS — F209 Schizophrenia, unspecified: Secondary | ICD-10-CM | POA: Diagnosis not present

## 2015-11-19 DIAGNOSIS — Z79899 Other long term (current) drug therapy: Secondary | ICD-10-CM | POA: Diagnosis not present

## 2015-11-19 DIAGNOSIS — F250 Schizoaffective disorder, bipolar type: Secondary | ICD-10-CM | POA: Diagnosis not present

## 2015-11-20 DIAGNOSIS — F250 Schizoaffective disorder, bipolar type: Secondary | ICD-10-CM | POA: Diagnosis not present

## 2015-12-02 DIAGNOSIS — F250 Schizoaffective disorder, bipolar type: Secondary | ICD-10-CM | POA: Diagnosis not present

## 2015-12-03 DIAGNOSIS — F209 Schizophrenia, unspecified: Secondary | ICD-10-CM | POA: Diagnosis not present

## 2015-12-03 DIAGNOSIS — F502 Bulimia nervosa: Secondary | ICD-10-CM | POA: Diagnosis not present

## 2015-12-03 DIAGNOSIS — Z69010 Encounter for mental health services for victim of parental child abuse: Secondary | ICD-10-CM | POA: Diagnosis not present

## 2015-12-04 DIAGNOSIS — F250 Schizoaffective disorder, bipolar type: Secondary | ICD-10-CM | POA: Diagnosis not present

## 2015-12-11 DIAGNOSIS — H25812 Combined forms of age-related cataract, left eye: Secondary | ICD-10-CM | POA: Diagnosis not present

## 2015-12-11 DIAGNOSIS — H20021 Recurrent acute iridocyclitis, right eye: Secondary | ICD-10-CM | POA: Diagnosis not present

## 2015-12-17 DIAGNOSIS — F502 Bulimia nervosa: Secondary | ICD-10-CM | POA: Diagnosis not present

## 2015-12-17 DIAGNOSIS — Z69010 Encounter for mental health services for victim of parental child abuse: Secondary | ICD-10-CM | POA: Diagnosis not present

## 2015-12-17 DIAGNOSIS — F209 Schizophrenia, unspecified: Secondary | ICD-10-CM | POA: Diagnosis not present

## 2015-12-18 DIAGNOSIS — Z79899 Other long term (current) drug therapy: Secondary | ICD-10-CM | POA: Diagnosis not present

## 2015-12-18 DIAGNOSIS — F250 Schizoaffective disorder, bipolar type: Secondary | ICD-10-CM | POA: Diagnosis not present

## 2015-12-25 DIAGNOSIS — F250 Schizoaffective disorder, bipolar type: Secondary | ICD-10-CM | POA: Diagnosis not present

## 2016-01-07 DIAGNOSIS — Z69010 Encounter for mental health services for victim of parental child abuse: Secondary | ICD-10-CM | POA: Diagnosis not present

## 2016-01-07 DIAGNOSIS — F209 Schizophrenia, unspecified: Secondary | ICD-10-CM | POA: Diagnosis not present

## 2016-01-07 DIAGNOSIS — F502 Bulimia nervosa: Secondary | ICD-10-CM | POA: Diagnosis not present

## 2016-01-14 DIAGNOSIS — Z79899 Other long term (current) drug therapy: Secondary | ICD-10-CM | POA: Diagnosis not present

## 2016-01-14 DIAGNOSIS — F250 Schizoaffective disorder, bipolar type: Secondary | ICD-10-CM | POA: Diagnosis not present

## 2016-01-15 DIAGNOSIS — F250 Schizoaffective disorder, bipolar type: Secondary | ICD-10-CM | POA: Diagnosis not present

## 2016-01-21 DIAGNOSIS — Z69010 Encounter for mental health services for victim of parental child abuse: Secondary | ICD-10-CM | POA: Diagnosis not present

## 2016-01-21 DIAGNOSIS — F502 Bulimia nervosa: Secondary | ICD-10-CM | POA: Diagnosis not present

## 2016-01-21 DIAGNOSIS — F209 Schizophrenia, unspecified: Secondary | ICD-10-CM | POA: Diagnosis not present

## 2016-01-22 DIAGNOSIS — F250 Schizoaffective disorder, bipolar type: Secondary | ICD-10-CM | POA: Diagnosis not present

## 2016-02-04 DIAGNOSIS — F209 Schizophrenia, unspecified: Secondary | ICD-10-CM | POA: Diagnosis not present

## 2016-02-04 DIAGNOSIS — Z69010 Encounter for mental health services for victim of parental child abuse: Secondary | ICD-10-CM | POA: Diagnosis not present

## 2016-02-04 DIAGNOSIS — F502 Bulimia nervosa: Secondary | ICD-10-CM | POA: Diagnosis not present

## 2016-02-05 DIAGNOSIS — H6123 Impacted cerumen, bilateral: Secondary | ICD-10-CM | POA: Diagnosis not present

## 2016-02-11 DIAGNOSIS — F250 Schizoaffective disorder, bipolar type: Secondary | ICD-10-CM | POA: Diagnosis not present

## 2016-02-11 DIAGNOSIS — Z79899 Other long term (current) drug therapy: Secondary | ICD-10-CM | POA: Diagnosis not present

## 2016-02-12 DIAGNOSIS — F250 Schizoaffective disorder, bipolar type: Secondary | ICD-10-CM | POA: Diagnosis not present

## 2016-02-17 DIAGNOSIS — F250 Schizoaffective disorder, bipolar type: Secondary | ICD-10-CM | POA: Diagnosis not present

## 2016-02-19 DIAGNOSIS — H6123 Impacted cerumen, bilateral: Secondary | ICD-10-CM | POA: Diagnosis not present

## 2016-02-25 DIAGNOSIS — F209 Schizophrenia, unspecified: Secondary | ICD-10-CM | POA: Diagnosis not present

## 2016-02-25 DIAGNOSIS — Z69010 Encounter for mental health services for victim of parental child abuse: Secondary | ICD-10-CM | POA: Diagnosis not present

## 2016-02-25 DIAGNOSIS — F502 Bulimia nervosa: Secondary | ICD-10-CM | POA: Diagnosis not present

## 2016-03-04 DIAGNOSIS — H6123 Impacted cerumen, bilateral: Secondary | ICD-10-CM | POA: Diagnosis not present

## 2016-03-04 DIAGNOSIS — Z6825 Body mass index (BMI) 25.0-25.9, adult: Secondary | ICD-10-CM | POA: Diagnosis not present

## 2016-03-04 DIAGNOSIS — F2089 Other schizophrenia: Secondary | ICD-10-CM | POA: Diagnosis not present

## 2016-03-04 DIAGNOSIS — F250 Schizoaffective disorder, bipolar type: Secondary | ICD-10-CM | POA: Diagnosis not present

## 2016-03-10 DIAGNOSIS — F250 Schizoaffective disorder, bipolar type: Secondary | ICD-10-CM | POA: Diagnosis not present

## 2016-03-10 DIAGNOSIS — F209 Schizophrenia, unspecified: Secondary | ICD-10-CM | POA: Diagnosis not present

## 2016-03-10 DIAGNOSIS — H25813 Combined forms of age-related cataract, bilateral: Secondary | ICD-10-CM | POA: Diagnosis not present

## 2016-03-10 DIAGNOSIS — F502 Bulimia nervosa: Secondary | ICD-10-CM | POA: Diagnosis not present

## 2016-03-10 DIAGNOSIS — Z79899 Other long term (current) drug therapy: Secondary | ICD-10-CM | POA: Diagnosis not present

## 2016-03-10 DIAGNOSIS — Z69010 Encounter for mental health services for victim of parental child abuse: Secondary | ICD-10-CM | POA: Diagnosis not present

## 2016-03-11 DIAGNOSIS — Z1211 Encounter for screening for malignant neoplasm of colon: Secondary | ICD-10-CM | POA: Diagnosis not present

## 2016-03-11 DIAGNOSIS — K5904 Chronic idiopathic constipation: Secondary | ICD-10-CM | POA: Diagnosis not present

## 2016-03-15 DIAGNOSIS — H25812 Combined forms of age-related cataract, left eye: Secondary | ICD-10-CM | POA: Diagnosis not present

## 2016-03-15 DIAGNOSIS — H25042 Posterior subcapsular polar age-related cataract, left eye: Secondary | ICD-10-CM | POA: Diagnosis not present

## 2016-03-15 DIAGNOSIS — H21562 Pupillary abnormality, left eye: Secondary | ICD-10-CM | POA: Diagnosis not present

## 2016-03-24 DIAGNOSIS — F502 Bulimia nervosa: Secondary | ICD-10-CM | POA: Diagnosis not present

## 2016-03-24 DIAGNOSIS — Z69010 Encounter for mental health services for victim of parental child abuse: Secondary | ICD-10-CM | POA: Diagnosis not present

## 2016-03-24 DIAGNOSIS — F209 Schizophrenia, unspecified: Secondary | ICD-10-CM | POA: Diagnosis not present

## 2016-04-01 DIAGNOSIS — F250 Schizoaffective disorder, bipolar type: Secondary | ICD-10-CM | POA: Diagnosis not present

## 2016-04-05 DIAGNOSIS — K219 Gastro-esophageal reflux disease without esophagitis: Secondary | ICD-10-CM | POA: Diagnosis not present

## 2016-04-05 DIAGNOSIS — Z6825 Body mass index (BMI) 25.0-25.9, adult: Secondary | ICD-10-CM | POA: Diagnosis not present

## 2016-04-05 DIAGNOSIS — F250 Schizoaffective disorder, bipolar type: Secondary | ICD-10-CM | POA: Diagnosis not present

## 2016-04-07 DIAGNOSIS — Z79899 Other long term (current) drug therapy: Secondary | ICD-10-CM | POA: Diagnosis not present

## 2016-04-07 DIAGNOSIS — F250 Schizoaffective disorder, bipolar type: Secondary | ICD-10-CM | POA: Diagnosis not present

## 2016-04-08 DIAGNOSIS — F502 Bulimia nervosa: Secondary | ICD-10-CM | POA: Diagnosis not present

## 2016-04-08 DIAGNOSIS — F209 Schizophrenia, unspecified: Secondary | ICD-10-CM | POA: Diagnosis not present

## 2016-04-08 DIAGNOSIS — Z69010 Encounter for mental health services for victim of parental child abuse: Secondary | ICD-10-CM | POA: Diagnosis not present

## 2016-04-08 DIAGNOSIS — F250 Schizoaffective disorder, bipolar type: Secondary | ICD-10-CM | POA: Diagnosis not present

## 2016-04-13 DIAGNOSIS — K219 Gastro-esophageal reflux disease without esophagitis: Secondary | ICD-10-CM | POA: Diagnosis not present

## 2016-04-13 DIAGNOSIS — F250 Schizoaffective disorder, bipolar type: Secondary | ICD-10-CM | POA: Diagnosis not present

## 2016-04-13 DIAGNOSIS — Z79899 Other long term (current) drug therapy: Secondary | ICD-10-CM | POA: Diagnosis not present

## 2016-05-04 DIAGNOSIS — F250 Schizoaffective disorder, bipolar type: Secondary | ICD-10-CM | POA: Diagnosis not present

## 2016-05-05 DIAGNOSIS — Z69010 Encounter for mental health services for victim of parental child abuse: Secondary | ICD-10-CM | POA: Diagnosis not present

## 2016-05-05 DIAGNOSIS — F209 Schizophrenia, unspecified: Secondary | ICD-10-CM | POA: Diagnosis not present

## 2016-05-05 DIAGNOSIS — F502 Bulimia nervosa: Secondary | ICD-10-CM | POA: Diagnosis not present

## 2016-05-10 DIAGNOSIS — Z79899 Other long term (current) drug therapy: Secondary | ICD-10-CM | POA: Diagnosis not present

## 2016-05-10 DIAGNOSIS — F250 Schizoaffective disorder, bipolar type: Secondary | ICD-10-CM | POA: Diagnosis not present

## 2016-05-27 DIAGNOSIS — F250 Schizoaffective disorder, bipolar type: Secondary | ICD-10-CM | POA: Diagnosis not present

## 2016-06-03 DIAGNOSIS — F209 Schizophrenia, unspecified: Secondary | ICD-10-CM | POA: Diagnosis not present

## 2016-06-03 DIAGNOSIS — Z69010 Encounter for mental health services for victim of parental child abuse: Secondary | ICD-10-CM | POA: Diagnosis not present

## 2016-06-03 DIAGNOSIS — F502 Bulimia nervosa: Secondary | ICD-10-CM | POA: Diagnosis not present

## 2016-06-07 DIAGNOSIS — F250 Schizoaffective disorder, bipolar type: Secondary | ICD-10-CM | POA: Diagnosis not present

## 2016-06-07 DIAGNOSIS — Z79899 Other long term (current) drug therapy: Secondary | ICD-10-CM | POA: Diagnosis not present

## 2016-06-16 DIAGNOSIS — F209 Schizophrenia, unspecified: Secondary | ICD-10-CM | POA: Diagnosis not present

## 2016-06-16 DIAGNOSIS — F502 Bulimia nervosa: Secondary | ICD-10-CM | POA: Diagnosis not present

## 2016-06-16 DIAGNOSIS — Z69010 Encounter for mental health services for victim of parental child abuse: Secondary | ICD-10-CM | POA: Diagnosis not present

## 2016-06-28 DIAGNOSIS — R740 Nonspecific elevation of levels of transaminase and lactic acid dehydrogenase [LDH]: Secondary | ICD-10-CM | POA: Diagnosis not present

## 2016-07-06 DIAGNOSIS — Z79899 Other long term (current) drug therapy: Secondary | ICD-10-CM | POA: Diagnosis not present

## 2016-07-06 DIAGNOSIS — F250 Schizoaffective disorder, bipolar type: Secondary | ICD-10-CM | POA: Diagnosis not present

## 2016-07-07 DIAGNOSIS — H25812 Combined forms of age-related cataract, left eye: Secondary | ICD-10-CM | POA: Diagnosis not present

## 2016-07-08 DIAGNOSIS — F250 Schizoaffective disorder, bipolar type: Secondary | ICD-10-CM | POA: Diagnosis not present

## 2016-07-15 DIAGNOSIS — F250 Schizoaffective disorder, bipolar type: Secondary | ICD-10-CM | POA: Diagnosis not present

## 2016-07-22 DIAGNOSIS — F250 Schizoaffective disorder, bipolar type: Secondary | ICD-10-CM | POA: Diagnosis not present

## 2016-08-02 DIAGNOSIS — F250 Schizoaffective disorder, bipolar type: Secondary | ICD-10-CM | POA: Diagnosis not present

## 2016-08-02 DIAGNOSIS — Z79899 Other long term (current) drug therapy: Secondary | ICD-10-CM | POA: Diagnosis not present

## 2016-08-05 DIAGNOSIS — F250 Schizoaffective disorder, bipolar type: Secondary | ICD-10-CM | POA: Diagnosis not present

## 2016-08-18 DIAGNOSIS — F250 Schizoaffective disorder, bipolar type: Secondary | ICD-10-CM | POA: Diagnosis not present

## 2016-08-25 DIAGNOSIS — F250 Schizoaffective disorder, bipolar type: Secondary | ICD-10-CM | POA: Diagnosis not present

## 2016-08-30 DIAGNOSIS — Z79899 Other long term (current) drug therapy: Secondary | ICD-10-CM | POA: Diagnosis not present

## 2016-08-30 DIAGNOSIS — F250 Schizoaffective disorder, bipolar type: Secondary | ICD-10-CM | POA: Diagnosis not present

## 2016-09-01 DIAGNOSIS — F250 Schizoaffective disorder, bipolar type: Secondary | ICD-10-CM | POA: Diagnosis not present

## 2016-09-08 DIAGNOSIS — F250 Schizoaffective disorder, bipolar type: Secondary | ICD-10-CM | POA: Diagnosis not present

## 2016-09-15 DIAGNOSIS — F250 Schizoaffective disorder, bipolar type: Secondary | ICD-10-CM | POA: Diagnosis not present

## 2016-09-22 DIAGNOSIS — F250 Schizoaffective disorder, bipolar type: Secondary | ICD-10-CM | POA: Diagnosis not present

## 2016-09-29 DIAGNOSIS — F250 Schizoaffective disorder, bipolar type: Secondary | ICD-10-CM | POA: Diagnosis not present

## 2016-09-29 DIAGNOSIS — Z79899 Other long term (current) drug therapy: Secondary | ICD-10-CM | POA: Diagnosis not present

## 2016-10-26 DIAGNOSIS — F250 Schizoaffective disorder, bipolar type: Secondary | ICD-10-CM | POA: Diagnosis not present

## 2016-10-26 DIAGNOSIS — Z79899 Other long term (current) drug therapy: Secondary | ICD-10-CM | POA: Diagnosis not present

## 2016-11-03 DIAGNOSIS — F250 Schizoaffective disorder, bipolar type: Secondary | ICD-10-CM | POA: Diagnosis not present

## 2016-11-10 DIAGNOSIS — F250 Schizoaffective disorder, bipolar type: Secondary | ICD-10-CM | POA: Diagnosis not present

## 2016-11-17 DIAGNOSIS — F250 Schizoaffective disorder, bipolar type: Secondary | ICD-10-CM | POA: Diagnosis not present

## 2016-11-23 DIAGNOSIS — F250 Schizoaffective disorder, bipolar type: Secondary | ICD-10-CM | POA: Diagnosis not present

## 2016-11-23 DIAGNOSIS — Z79899 Other long term (current) drug therapy: Secondary | ICD-10-CM | POA: Diagnosis not present

## 2016-11-24 DIAGNOSIS — F250 Schizoaffective disorder, bipolar type: Secondary | ICD-10-CM | POA: Diagnosis not present

## 2016-12-01 DIAGNOSIS — F250 Schizoaffective disorder, bipolar type: Secondary | ICD-10-CM | POA: Diagnosis not present

## 2016-12-08 DIAGNOSIS — F250 Schizoaffective disorder, bipolar type: Secondary | ICD-10-CM | POA: Diagnosis not present

## 2016-12-15 DIAGNOSIS — F250 Schizoaffective disorder, bipolar type: Secondary | ICD-10-CM | POA: Diagnosis not present

## 2016-12-22 DIAGNOSIS — F250 Schizoaffective disorder, bipolar type: Secondary | ICD-10-CM | POA: Diagnosis not present

## 2016-12-28 DIAGNOSIS — Z79899 Other long term (current) drug therapy: Secondary | ICD-10-CM | POA: Diagnosis not present

## 2016-12-28 DIAGNOSIS — F250 Schizoaffective disorder, bipolar type: Secondary | ICD-10-CM | POA: Diagnosis not present

## 2016-12-29 DIAGNOSIS — F250 Schizoaffective disorder, bipolar type: Secondary | ICD-10-CM | POA: Diagnosis not present

## 2017-01-12 DIAGNOSIS — F250 Schizoaffective disorder, bipolar type: Secondary | ICD-10-CM | POA: Diagnosis not present

## 2017-01-19 DIAGNOSIS — F250 Schizoaffective disorder, bipolar type: Secondary | ICD-10-CM | POA: Diagnosis not present

## 2017-01-24 DIAGNOSIS — F250 Schizoaffective disorder, bipolar type: Secondary | ICD-10-CM | POA: Diagnosis not present

## 2017-01-26 DIAGNOSIS — F250 Schizoaffective disorder, bipolar type: Secondary | ICD-10-CM | POA: Diagnosis not present

## 2017-02-02 DIAGNOSIS — F250 Schizoaffective disorder, bipolar type: Secondary | ICD-10-CM | POA: Diagnosis not present

## 2017-02-09 DIAGNOSIS — F250 Schizoaffective disorder, bipolar type: Secondary | ICD-10-CM | POA: Diagnosis not present

## 2017-02-16 DIAGNOSIS — F250 Schizoaffective disorder, bipolar type: Secondary | ICD-10-CM | POA: Diagnosis not present

## 2017-02-21 DIAGNOSIS — F250 Schizoaffective disorder, bipolar type: Secondary | ICD-10-CM | POA: Diagnosis not present

## 2017-02-21 DIAGNOSIS — Z79899 Other long term (current) drug therapy: Secondary | ICD-10-CM | POA: Diagnosis not present

## 2017-02-23 DIAGNOSIS — F250 Schizoaffective disorder, bipolar type: Secondary | ICD-10-CM | POA: Diagnosis not present

## 2017-03-02 DIAGNOSIS — F250 Schizoaffective disorder, bipolar type: Secondary | ICD-10-CM | POA: Diagnosis not present

## 2017-03-09 DIAGNOSIS — F250 Schizoaffective disorder, bipolar type: Secondary | ICD-10-CM | POA: Diagnosis not present

## 2017-03-16 DIAGNOSIS — F250 Schizoaffective disorder, bipolar type: Secondary | ICD-10-CM | POA: Diagnosis not present

## 2017-03-23 DIAGNOSIS — F250 Schizoaffective disorder, bipolar type: Secondary | ICD-10-CM | POA: Diagnosis not present

## 2017-03-24 DIAGNOSIS — F250 Schizoaffective disorder, bipolar type: Secondary | ICD-10-CM | POA: Diagnosis not present

## 2017-03-24 DIAGNOSIS — Z79899 Other long term (current) drug therapy: Secondary | ICD-10-CM | POA: Diagnosis not present

## 2017-03-28 DIAGNOSIS — Z23 Encounter for immunization: Secondary | ICD-10-CM | POA: Diagnosis not present

## 2017-03-30 DIAGNOSIS — F250 Schizoaffective disorder, bipolar type: Secondary | ICD-10-CM | POA: Diagnosis not present

## 2017-04-13 DIAGNOSIS — F250 Schizoaffective disorder, bipolar type: Secondary | ICD-10-CM | POA: Diagnosis not present

## 2017-04-21 DIAGNOSIS — Z79899 Other long term (current) drug therapy: Secondary | ICD-10-CM | POA: Diagnosis not present

## 2017-04-21 DIAGNOSIS — F250 Schizoaffective disorder, bipolar type: Secondary | ICD-10-CM | POA: Diagnosis not present

## 2017-04-27 DIAGNOSIS — F250 Schizoaffective disorder, bipolar type: Secondary | ICD-10-CM | POA: Diagnosis not present

## 2017-05-04 DIAGNOSIS — F250 Schizoaffective disorder, bipolar type: Secondary | ICD-10-CM | POA: Diagnosis not present

## 2017-05-18 DIAGNOSIS — F250 Schizoaffective disorder, bipolar type: Secondary | ICD-10-CM | POA: Diagnosis not present

## 2017-05-23 DIAGNOSIS — F250 Schizoaffective disorder, bipolar type: Secondary | ICD-10-CM | POA: Diagnosis not present

## 2017-05-23 DIAGNOSIS — Z79899 Other long term (current) drug therapy: Secondary | ICD-10-CM | POA: Diagnosis not present

## 2017-05-25 DIAGNOSIS — F250 Schizoaffective disorder, bipolar type: Secondary | ICD-10-CM | POA: Diagnosis not present

## 2017-06-01 DIAGNOSIS — F250 Schizoaffective disorder, bipolar type: Secondary | ICD-10-CM | POA: Diagnosis not present

## 2017-06-15 DIAGNOSIS — F250 Schizoaffective disorder, bipolar type: Secondary | ICD-10-CM | POA: Diagnosis not present

## 2017-06-20 DIAGNOSIS — F250 Schizoaffective disorder, bipolar type: Secondary | ICD-10-CM | POA: Diagnosis not present

## 2017-06-20 DIAGNOSIS — Z79899 Other long term (current) drug therapy: Secondary | ICD-10-CM | POA: Diagnosis not present

## 2017-06-22 DIAGNOSIS — F250 Schizoaffective disorder, bipolar type: Secondary | ICD-10-CM | POA: Diagnosis not present

## 2017-06-29 DIAGNOSIS — F250 Schizoaffective disorder, bipolar type: Secondary | ICD-10-CM | POA: Diagnosis not present

## 2017-07-06 DIAGNOSIS — F250 Schizoaffective disorder, bipolar type: Secondary | ICD-10-CM | POA: Diagnosis not present

## 2017-07-13 DIAGNOSIS — F250 Schizoaffective disorder, bipolar type: Secondary | ICD-10-CM | POA: Diagnosis not present

## 2017-07-20 DIAGNOSIS — F250 Schizoaffective disorder, bipolar type: Secondary | ICD-10-CM | POA: Diagnosis not present

## 2017-07-20 DIAGNOSIS — Z79899 Other long term (current) drug therapy: Secondary | ICD-10-CM | POA: Diagnosis not present

## 2017-07-27 DIAGNOSIS — F250 Schizoaffective disorder, bipolar type: Secondary | ICD-10-CM | POA: Diagnosis not present

## 2017-08-10 DIAGNOSIS — F250 Schizoaffective disorder, bipolar type: Secondary | ICD-10-CM | POA: Diagnosis not present

## 2017-08-17 DIAGNOSIS — F250 Schizoaffective disorder, bipolar type: Secondary | ICD-10-CM | POA: Diagnosis not present

## 2017-08-19 DIAGNOSIS — F250 Schizoaffective disorder, bipolar type: Secondary | ICD-10-CM | POA: Diagnosis not present

## 2017-08-22 DIAGNOSIS — Z79899 Other long term (current) drug therapy: Secondary | ICD-10-CM | POA: Diagnosis not present

## 2017-08-22 DIAGNOSIS — F250 Schizoaffective disorder, bipolar type: Secondary | ICD-10-CM | POA: Diagnosis not present

## 2017-08-24 DIAGNOSIS — F250 Schizoaffective disorder, bipolar type: Secondary | ICD-10-CM | POA: Diagnosis not present

## 2017-08-31 DIAGNOSIS — F250 Schizoaffective disorder, bipolar type: Secondary | ICD-10-CM | POA: Diagnosis not present

## 2017-09-07 DIAGNOSIS — F250 Schizoaffective disorder, bipolar type: Secondary | ICD-10-CM | POA: Diagnosis not present

## 2017-09-15 DIAGNOSIS — F250 Schizoaffective disorder, bipolar type: Secondary | ICD-10-CM | POA: Diagnosis not present

## 2017-09-21 DIAGNOSIS — F250 Schizoaffective disorder, bipolar type: Secondary | ICD-10-CM | POA: Diagnosis not present

## 2017-09-21 DIAGNOSIS — Z79899 Other long term (current) drug therapy: Secondary | ICD-10-CM | POA: Diagnosis not present

## 2017-09-28 DIAGNOSIS — F250 Schizoaffective disorder, bipolar type: Secondary | ICD-10-CM | POA: Diagnosis not present

## 2017-10-11 DIAGNOSIS — F250 Schizoaffective disorder, bipolar type: Secondary | ICD-10-CM | POA: Diagnosis not present

## 2017-10-17 DIAGNOSIS — F2089 Other schizophrenia: Secondary | ICD-10-CM | POA: Diagnosis not present

## 2017-10-17 DIAGNOSIS — R5383 Other fatigue: Secondary | ICD-10-CM | POA: Diagnosis not present

## 2017-10-17 DIAGNOSIS — R7303 Prediabetes: Secondary | ICD-10-CM | POA: Diagnosis not present

## 2017-10-19 DIAGNOSIS — Z79899 Other long term (current) drug therapy: Secondary | ICD-10-CM | POA: Diagnosis not present

## 2017-10-19 DIAGNOSIS — F250 Schizoaffective disorder, bipolar type: Secondary | ICD-10-CM | POA: Diagnosis not present

## 2017-10-26 DIAGNOSIS — F250 Schizoaffective disorder, bipolar type: Secondary | ICD-10-CM | POA: Diagnosis not present

## 2017-11-02 DIAGNOSIS — F250 Schizoaffective disorder, bipolar type: Secondary | ICD-10-CM | POA: Diagnosis not present

## 2017-11-09 DIAGNOSIS — F250 Schizoaffective disorder, bipolar type: Secondary | ICD-10-CM | POA: Diagnosis not present

## 2017-11-10 DIAGNOSIS — F250 Schizoaffective disorder, bipolar type: Secondary | ICD-10-CM | POA: Diagnosis not present

## 2017-11-14 DIAGNOSIS — E28310 Symptomatic premature menopause: Secondary | ICD-10-CM | POA: Diagnosis not present

## 2017-11-14 DIAGNOSIS — F2089 Other schizophrenia: Secondary | ICD-10-CM | POA: Diagnosis not present

## 2017-11-14 DIAGNOSIS — R5383 Other fatigue: Secondary | ICD-10-CM | POA: Diagnosis not present

## 2017-11-14 DIAGNOSIS — Z6828 Body mass index (BMI) 28.0-28.9, adult: Secondary | ICD-10-CM | POA: Diagnosis not present

## 2017-11-16 DIAGNOSIS — F250 Schizoaffective disorder, bipolar type: Secondary | ICD-10-CM | POA: Diagnosis not present

## 2017-11-21 DIAGNOSIS — Z79899 Other long term (current) drug therapy: Secondary | ICD-10-CM | POA: Diagnosis not present

## 2017-11-21 DIAGNOSIS — F250 Schizoaffective disorder, bipolar type: Secondary | ICD-10-CM | POA: Diagnosis not present

## 2017-11-23 DIAGNOSIS — F250 Schizoaffective disorder, bipolar type: Secondary | ICD-10-CM | POA: Diagnosis not present

## 2017-11-28 DIAGNOSIS — F250 Schizoaffective disorder, bipolar type: Secondary | ICD-10-CM | POA: Diagnosis not present

## 2017-12-07 DIAGNOSIS — F250 Schizoaffective disorder, bipolar type: Secondary | ICD-10-CM | POA: Diagnosis not present

## 2017-12-16 DIAGNOSIS — H1011 Acute atopic conjunctivitis, right eye: Secondary | ICD-10-CM | POA: Diagnosis not present

## 2017-12-16 DIAGNOSIS — J069 Acute upper respiratory infection, unspecified: Secondary | ICD-10-CM | POA: Diagnosis not present

## 2017-12-16 DIAGNOSIS — Z6825 Body mass index (BMI) 25.0-25.9, adult: Secondary | ICD-10-CM | POA: Diagnosis not present

## 2017-12-19 DIAGNOSIS — F250 Schizoaffective disorder, bipolar type: Secondary | ICD-10-CM | POA: Diagnosis not present

## 2017-12-19 DIAGNOSIS — Z79899 Other long term (current) drug therapy: Secondary | ICD-10-CM | POA: Diagnosis not present

## 2017-12-21 DIAGNOSIS — F250 Schizoaffective disorder, bipolar type: Secondary | ICD-10-CM | POA: Diagnosis not present

## 2017-12-28 DIAGNOSIS — F250 Schizoaffective disorder, bipolar type: Secondary | ICD-10-CM | POA: Diagnosis not present

## 2018-01-04 DIAGNOSIS — F250 Schizoaffective disorder, bipolar type: Secondary | ICD-10-CM | POA: Diagnosis not present

## 2018-01-11 DIAGNOSIS — F250 Schizoaffective disorder, bipolar type: Secondary | ICD-10-CM | POA: Diagnosis not present

## 2018-01-16 DIAGNOSIS — Z79899 Other long term (current) drug therapy: Secondary | ICD-10-CM | POA: Diagnosis not present

## 2018-01-16 DIAGNOSIS — F250 Schizoaffective disorder, bipolar type: Secondary | ICD-10-CM | POA: Diagnosis not present

## 2018-01-18 DIAGNOSIS — F250 Schizoaffective disorder, bipolar type: Secondary | ICD-10-CM | POA: Diagnosis not present

## 2018-01-25 DIAGNOSIS — F250 Schizoaffective disorder, bipolar type: Secondary | ICD-10-CM | POA: Diagnosis not present

## 2018-02-01 DIAGNOSIS — F250 Schizoaffective disorder, bipolar type: Secondary | ICD-10-CM | POA: Diagnosis not present

## 2018-02-08 DIAGNOSIS — F250 Schizoaffective disorder, bipolar type: Secondary | ICD-10-CM | POA: Diagnosis not present

## 2018-02-15 DIAGNOSIS — F250 Schizoaffective disorder, bipolar type: Secondary | ICD-10-CM | POA: Diagnosis not present

## 2018-02-17 DIAGNOSIS — F250 Schizoaffective disorder, bipolar type: Secondary | ICD-10-CM | POA: Diagnosis not present

## 2018-02-17 DIAGNOSIS — Z79899 Other long term (current) drug therapy: Secondary | ICD-10-CM | POA: Diagnosis not present

## 2018-03-01 DIAGNOSIS — F250 Schizoaffective disorder, bipolar type: Secondary | ICD-10-CM | POA: Diagnosis not present

## 2018-03-08 DIAGNOSIS — F250 Schizoaffective disorder, bipolar type: Secondary | ICD-10-CM | POA: Diagnosis not present

## 2018-03-10 DIAGNOSIS — Z23 Encounter for immunization: Secondary | ICD-10-CM | POA: Diagnosis not present

## 2018-03-15 DIAGNOSIS — F250 Schizoaffective disorder, bipolar type: Secondary | ICD-10-CM | POA: Diagnosis not present

## 2018-03-17 DIAGNOSIS — Z79899 Other long term (current) drug therapy: Secondary | ICD-10-CM | POA: Diagnosis not present

## 2018-03-17 DIAGNOSIS — F250 Schizoaffective disorder, bipolar type: Secondary | ICD-10-CM | POA: Diagnosis not present

## 2018-03-29 DIAGNOSIS — F250 Schizoaffective disorder, bipolar type: Secondary | ICD-10-CM | POA: Diagnosis not present

## 2018-04-05 DIAGNOSIS — F250 Schizoaffective disorder, bipolar type: Secondary | ICD-10-CM | POA: Diagnosis not present

## 2018-04-12 DIAGNOSIS — Z79899 Other long term (current) drug therapy: Secondary | ICD-10-CM | POA: Diagnosis not present

## 2018-04-12 DIAGNOSIS — F250 Schizoaffective disorder, bipolar type: Secondary | ICD-10-CM | POA: Diagnosis not present

## 2018-05-15 DIAGNOSIS — F250 Schizoaffective disorder, bipolar type: Secondary | ICD-10-CM | POA: Diagnosis not present

## 2018-05-15 DIAGNOSIS — Z79899 Other long term (current) drug therapy: Secondary | ICD-10-CM | POA: Diagnosis not present

## 2018-05-20 DIAGNOSIS — F250 Schizoaffective disorder, bipolar type: Secondary | ICD-10-CM | POA: Diagnosis not present

## 2018-05-24 DIAGNOSIS — F250 Schizoaffective disorder, bipolar type: Secondary | ICD-10-CM | POA: Diagnosis not present

## 2018-06-05 DIAGNOSIS — F250 Schizoaffective disorder, bipolar type: Secondary | ICD-10-CM | POA: Diagnosis not present

## 2018-06-05 DIAGNOSIS — Z79899 Other long term (current) drug therapy: Secondary | ICD-10-CM | POA: Diagnosis not present

## 2018-06-06 DIAGNOSIS — F250 Schizoaffective disorder, bipolar type: Secondary | ICD-10-CM | POA: Diagnosis not present

## 2018-06-07 DIAGNOSIS — F250 Schizoaffective disorder, bipolar type: Secondary | ICD-10-CM | POA: Diagnosis not present

## 2018-06-14 DIAGNOSIS — F250 Schizoaffective disorder, bipolar type: Secondary | ICD-10-CM | POA: Diagnosis not present

## 2018-06-21 DIAGNOSIS — F250 Schizoaffective disorder, bipolar type: Secondary | ICD-10-CM | POA: Diagnosis not present

## 2018-07-07 DIAGNOSIS — Z79899 Other long term (current) drug therapy: Secondary | ICD-10-CM | POA: Diagnosis not present

## 2018-07-07 DIAGNOSIS — F250 Schizoaffective disorder, bipolar type: Secondary | ICD-10-CM | POA: Diagnosis not present

## 2018-08-02 DIAGNOSIS — Z79899 Other long term (current) drug therapy: Secondary | ICD-10-CM | POA: Diagnosis not present

## 2018-08-02 DIAGNOSIS — F250 Schizoaffective disorder, bipolar type: Secondary | ICD-10-CM | POA: Diagnosis not present

## 2018-09-08 DIAGNOSIS — Z79899 Other long term (current) drug therapy: Secondary | ICD-10-CM | POA: Diagnosis not present

## 2018-09-08 DIAGNOSIS — F250 Schizoaffective disorder, bipolar type: Secondary | ICD-10-CM | POA: Diagnosis not present

## 2018-10-04 DIAGNOSIS — F250 Schizoaffective disorder, bipolar type: Secondary | ICD-10-CM | POA: Diagnosis not present

## 2018-10-04 DIAGNOSIS — Z79899 Other long term (current) drug therapy: Secondary | ICD-10-CM | POA: Diagnosis not present

## 2018-10-05 DIAGNOSIS — F250 Schizoaffective disorder, bipolar type: Secondary | ICD-10-CM | POA: Diagnosis not present

## 2018-11-02 DIAGNOSIS — F250 Schizoaffective disorder, bipolar type: Secondary | ICD-10-CM | POA: Diagnosis not present

## 2018-11-02 DIAGNOSIS — Z79899 Other long term (current) drug therapy: Secondary | ICD-10-CM | POA: Diagnosis not present

## 2018-11-27 DIAGNOSIS — F064 Anxiety disorder due to known physiological condition: Secondary | ICD-10-CM | POA: Diagnosis not present

## 2018-11-27 DIAGNOSIS — Z1272 Encounter for screening for malignant neoplasm of vagina: Secondary | ICD-10-CM | POA: Diagnosis not present

## 2018-12-04 DIAGNOSIS — Z79899 Other long term (current) drug therapy: Secondary | ICD-10-CM | POA: Diagnosis not present

## 2018-12-04 DIAGNOSIS — F250 Schizoaffective disorder, bipolar type: Secondary | ICD-10-CM | POA: Diagnosis not present

## 2018-12-13 ENCOUNTER — Other Ambulatory Visit: Payer: Self-pay | Admitting: Family Medicine

## 2018-12-13 ENCOUNTER — Other Ambulatory Visit (HOSPITAL_COMMUNITY)
Admission: RE | Admit: 2018-12-13 | Discharge: 2018-12-13 | Disposition: A | Discharge status: Home / Self Care | Payer: Medicare Other | Source: Ambulatory Visit | Attending: Family Medicine | Admitting: Family Medicine

## 2018-12-13 DIAGNOSIS — Z124 Encounter for screening for malignant neoplasm of cervix: Secondary | ICD-10-CM | POA: Insufficient documentation

## 2018-12-13 DIAGNOSIS — D251 Intramural leiomyoma of uterus: Secondary | ICD-10-CM | POA: Insufficient documentation

## 2018-12-13 DIAGNOSIS — Z01411 Encounter for gynecological examination (general) (routine) with abnormal findings: Secondary | ICD-10-CM | POA: Diagnosis not present

## 2018-12-13 DIAGNOSIS — Z1272 Encounter for screening for malignant neoplasm of vagina: Secondary | ICD-10-CM | POA: Diagnosis not present

## 2018-12-13 DIAGNOSIS — M62838 Other muscle spasm: Secondary | ICD-10-CM | POA: Diagnosis not present

## 2018-12-18 LAB — CYTOLOGY - PAP
Diagnosis: NEGATIVE
HPV: NOT DETECTED

## 2018-12-26 DIAGNOSIS — F250 Schizoaffective disorder, bipolar type: Secondary | ICD-10-CM | POA: Diagnosis not present

## 2018-12-29 ENCOUNTER — Ambulatory Visit: Payer: Medicare Other | Attending: Family Medicine | Admitting: Physical Therapy

## 2018-12-29 ENCOUNTER — Encounter: Payer: Self-pay | Admitting: Physical Therapy

## 2018-12-29 ENCOUNTER — Other Ambulatory Visit: Payer: Self-pay

## 2018-12-29 DIAGNOSIS — M62838 Other muscle spasm: Secondary | ICD-10-CM | POA: Diagnosis not present

## 2018-12-29 DIAGNOSIS — M542 Cervicalgia: Secondary | ICD-10-CM | POA: Diagnosis not present

## 2018-12-29 NOTE — Therapy (Addendum)
[place], [place], [address] Phone: [phone]   Fax:  [phone]  Physical Therapy Evaluation / Discharge  Patient Details  Name: Opel Lejeune MRN: [id number] Date of Birth: 08/17/1960 Referring Provider (PT): Lucianne Lei, MD    Encounter Date: 12/29/2018  PT End of Session - 12/29/18 1129    Visit Number  1    Number of Visits  3    Date for PT Re-Evaluation  01/26/19    Authorization Type  MCR: KX mod by 15th visit progress note at 10th    PT Start Time  1045    PT Stop Time  1125    PT Time Calculation (min)  40 min    Activity Tolerance  Patient tolerated treatment well    Behavior During Therapy  [facility] for tasks assessed/performed       Past Medical History:  Diagnosis Date  . Anxiety    pt reported  . Cataract   . Depression    pt reported  . GERD (gastroesophageal reflux disease)    pt reported  . Incontinence 2005  . Schizoaffective disorder [facility])     Past Surgical History:  Procedure Laterality Date  . CATARACT EXTRACTION    . cyst removed from back     . EYE SURGERY      There were no vitals filed for this visit.   Subjective Assessment - 12/29/18 1057    Subjective  pt is a 58 y.o F with CC of R sided neck pain that started 3-4 weeks ago with no MOI aside from reporting it could be from sleeping position. she reported pain looking into the R. she denies any N/T or referred pain. she denies any PMHX of neck pain or symtpoms. She does report since onset it has significantly improved    How long can you sit comfortably?  unlimited    How long can you stand comfortably?  unlimited    How long can you walk comfortably?  unlimited    Diagnostic tests  N/A    Patient Stated Goals  get some exercise to reduce it from coming back    Currently in Pain?  Yes    Pain Score  3    at worst 10/10   Pain Location  Neck    Pain Orientation  Right;Posterior    Pain Descriptors /  Indicators  Aching;Sharp    Pain Onset  1 to 4 weeks ago    Pain Frequency  Intermittent    Aggravating Factors   laying down,    Pain Relieving Factors  movement, resting, avoiding turning the head    Effect of Pain on Daily Activities  limited sleeping         [facility] PT Assessment - 12/29/18 1040      Assessment   Medical Diagnosis  neck pain     Referring Provider (PT)  Lucianne Lei, MD     Onset Date/Surgical Date  --   3 weeks ago   Hand Dominance  Right    Next MD Visit  unsure    Prior Therapy  no      Precautions   Precautions  None      Restrictions   Weight Bearing Restrictions  No      Balance Screen   Has the patient fallen in the past 6 months  No      [place] residence  Living Arrangements  Alone    Type of [place] to enter    Entrance Stairs-Number of Steps  14    Entrance Stairs-Rails  Can reach both    Home Layout  One level    Home Equipment  None      Prior Function   Level of Independence  Independent with basic ADLs    Vocation  On disability    Leisure  reading, walking      Cognition   Overall Cognitive Status  Within Functional Limits for tasks assessed      Observation/Other Assessments   Focus on Therapeutic Outcomes (FOTO)   41% limited   predicted 31% limited     Posture/Postural Control   Posture/Postural Control  Postural limitations    Postural Limitations  Rounded Shoulders;Forward head      ROM / Strength   AROM / PROM / Strength  AROM;Strength      AROM   AROM Assessment Site  Cervical    Cervical Flexion  30   pulling at end range   Cervical Extension  48   reproduced concordant pain   Cervical - Right Side Bend  30   reproduced concordat pain   Cervical - Left Side Bend  30   pulling at end range   Cervical - Right Rotation  68    Cervical - Left Rotation  60      Strength   Strength Assessment Site  Shoulder;Hand    Right/Left Shoulder   Right;Left      Palpation   Palpation comment  TTP along R splenius capitis and along the R facets of the C6-C7      Special Tests    Special Tests  Cervical                Objective measurements completed on examination: See above findings.      [facility] PT Treatment/Exercise - 12/29/18 1040      Exercises   Exercises  Neck      Neck Exercises: Seated   Neck Retraction  10 reps;5 secs      Neck Exercises: Supine   Neck Retraction  10 reps;5 secs      Manual Therapy   Manual Therapy  Joint mobilization;Passive ROM    Joint Mobilization  C3-C6 R lateral gapping mobs grade III    Passive ROM  cervical rotation to the R/ L working into end ranges with L/ R sidebending      Neck Exercises: Stretches   Upper Trapezius Stretch  2 reps;30 seconds;Left             PT Education - 12/29/18 1128    Education Details  evaluation findings, POC, goals, HEP with form/ rationale. Anatomy of the neck regarding facet joints and biomechanics    Person(s) Educated  Patient    Methods  Explanation;Verbal cues;Handout    Comprehension  Verbalized understanding;Verbal cues required       PT Short Term Goals - 12/29/18 1136      PT SHORT TERM GOAL #1   Title  STG=LTG        PT Long Term Goals - 12/29/18 1136      PT LONG TERM GOAL #1   Title  increae cervical ROM to [facility] with </= 1/10 pain for ADLS and QOL    Time  4    Period  Weeks    Status  New  Target Date  01/26/19      PT LONG TERM GOAL #2   Title  Increase FOTO score to </= 31% limited to demo improvement in function    Time  4    Period  Weeks    Status  New    Target Date  01/26/19      PT LONG TERM GOAL #3   Title  pt to be I with with all HEP given as of last visit to maintain and progress current level of function    Time  4    Period  Weeks    Status  New    Target Date  01/26/19             Plan - 12/29/18 1130    Clinical Impression Statement  pt presents to OPPT with CC of  neck pain starting 3 weeks ago with no specific onset/ MOI. She demonstrates functional ROM with mild limitation noted with R sidebending and extension due to pain at end range. TTP along the R cervical paraspinals and specific soreness at R C6-C7. pt symptomology and pan response suggest high likelihood of facet irrirtation combined with muscle spasm. she would benefti from physical therapy to decrease neck pain, improve ROM and return pt to PLOF by addressing the deficits listed.    Personal Factors and Comorbidities  Age;Comorbidity 2    Comorbidities  hx or anxiety, depression    Stability/Clinical Decision Making  Evolving/Moderate complexity    Clinical Decision Making  Moderate    Rehab Potential  Good    PT Frequency  1x / week    PT Duration  4 weeks    PT Treatment/Interventions  ADLs/Self Care Home Management;Electrical Stimulation;Iontophoresis 43m/ml Dexamethasone;Moist Heat;Ultrasound;Therapeutic activities;Therapeutic exercise;Manual techniques;Passive range of motion;Dry needling;Taping;Patient/family education;Spinal Manipulations;Traction    PT Next Visit Plan  review/ update HEP. pt request only 1 visit in 2 weeks. R cervical gapping mobs, STW, posture. If doing well potential D/C    PT Home Exercise Plan  upper cervical rotation, upper trap stretch, chin tuck (supine and sitting)    Consulted and Agree with Plan of Care  Patient       Patient will benefit from skilled therapeutic intervention in order to improve the following deficits and impairments:  Postural dysfunction, Improper body mechanics, Increased muscle spasms, Decreased activity tolerance, Pain, Decreased range of motion, Decreased endurance  Visit Diagnosis: Cervicalgia  Other muscle spasm     Problem List There are no active problems to display for this patient.  KStarr LakePT, DPT, LAT, ATC  12/29/18  11:42 AM      [facility] [address]Phone: [phone]  Fax:  [phone] Name: KIsabelle MattMRN: [id number] of Birth: [birth year]   PHYSICAL THERAPY DISCHARGE SUMMARY  Visits from Start of Care: 1  Current functional level related to goals / functional outcomes: See goals   Remaining deficits: unknown   Education / Equipment: HEP  Plan: Patient agrees to discharge.  Patient goals were not met. Patient is being discharged due to not returning since the last visit.  ?????          PT, DPT, LAT, ATC  04/04/19  10:07 AM

## 2019-01-04 DIAGNOSIS — Z79899 Other long term (current) drug therapy: Secondary | ICD-10-CM | POA: Diagnosis not present

## 2019-01-04 DIAGNOSIS — F250 Schizoaffective disorder, bipolar type: Secondary | ICD-10-CM | POA: Diagnosis not present

## 2019-01-15 DIAGNOSIS — M130 Polyarthritis, unspecified: Secondary | ICD-10-CM | POA: Diagnosis not present

## 2019-01-16 ENCOUNTER — Ambulatory Visit: Payer: Medicare Other | Admitting: Physical Therapy

## 2019-01-18 DIAGNOSIS — Z23 Encounter for immunization: Secondary | ICD-10-CM | POA: Diagnosis not present

## 2019-01-31 DIAGNOSIS — F250 Schizoaffective disorder, bipolar type: Secondary | ICD-10-CM | POA: Diagnosis not present

## 2019-01-31 DIAGNOSIS — Z79899 Other long term (current) drug therapy: Secondary | ICD-10-CM | POA: Diagnosis not present

## 2019-03-02 DIAGNOSIS — F250 Schizoaffective disorder, bipolar type: Secondary | ICD-10-CM | POA: Diagnosis not present

## 2019-03-02 DIAGNOSIS — Z79899 Other long term (current) drug therapy: Secondary | ICD-10-CM | POA: Diagnosis not present

## 2019-03-19 DIAGNOSIS — F419 Anxiety disorder, unspecified: Secondary | ICD-10-CM | POA: Diagnosis not present

## 2019-03-19 DIAGNOSIS — F250 Schizoaffective disorder, bipolar type: Secondary | ICD-10-CM | POA: Diagnosis not present

## 2019-04-02 DIAGNOSIS — Z79899 Other long term (current) drug therapy: Secondary | ICD-10-CM | POA: Diagnosis not present

## 2019-04-02 DIAGNOSIS — F250 Schizoaffective disorder, bipolar type: Secondary | ICD-10-CM | POA: Diagnosis not present

## 2019-04-04 DIAGNOSIS — F064 Anxiety disorder due to known physiological condition: Secondary | ICD-10-CM | POA: Diagnosis not present

## 2019-04-04 DIAGNOSIS — K219 Gastro-esophageal reflux disease without esophagitis: Secondary | ICD-10-CM | POA: Diagnosis not present

## 2019-04-04 DIAGNOSIS — F2089 Other schizophrenia: Secondary | ICD-10-CM | POA: Diagnosis not present

## 2019-05-02 DIAGNOSIS — Z79899 Other long term (current) drug therapy: Secondary | ICD-10-CM | POA: Diagnosis not present

## 2019-05-02 DIAGNOSIS — F250 Schizoaffective disorder, bipolar type: Secondary | ICD-10-CM | POA: Diagnosis not present

## 2019-05-31 DIAGNOSIS — F250 Schizoaffective disorder, bipolar type: Secondary | ICD-10-CM | POA: Diagnosis not present

## 2019-05-31 DIAGNOSIS — Z79899 Other long term (current) drug therapy: Secondary | ICD-10-CM | POA: Diagnosis not present

## 2019-06-11 DIAGNOSIS — F250 Schizoaffective disorder, bipolar type: Secondary | ICD-10-CM | POA: Diagnosis not present

## 2019-06-11 DIAGNOSIS — F419 Anxiety disorder, unspecified: Secondary | ICD-10-CM | POA: Diagnosis not present

## 2019-07-06 DIAGNOSIS — J301 Allergic rhinitis due to pollen: Secondary | ICD-10-CM | POA: Diagnosis not present

## 2019-07-23 DIAGNOSIS — Z79899 Other long term (current) drug therapy: Secondary | ICD-10-CM | POA: Diagnosis not present

## 2019-07-23 DIAGNOSIS — F250 Schizoaffective disorder, bipolar type: Secondary | ICD-10-CM | POA: Diagnosis not present

## 2019-08-08 DIAGNOSIS — H539 Unspecified visual disturbance: Secondary | ICD-10-CM | POA: Diagnosis not present

## 2019-08-08 DIAGNOSIS — H43813 Vitreous degeneration, bilateral: Secondary | ICD-10-CM | POA: Diagnosis not present

## 2019-08-27 DIAGNOSIS — Z79899 Other long term (current) drug therapy: Secondary | ICD-10-CM | POA: Diagnosis not present

## 2019-08-27 DIAGNOSIS — F250 Schizoaffective disorder, bipolar type: Secondary | ICD-10-CM | POA: Diagnosis not present

## 2019-08-27 DIAGNOSIS — F419 Anxiety disorder, unspecified: Secondary | ICD-10-CM | POA: Diagnosis not present

## 2019-10-01 DIAGNOSIS — Z79899 Other long term (current) drug therapy: Secondary | ICD-10-CM | POA: Diagnosis not present

## 2019-10-01 DIAGNOSIS — F250 Schizoaffective disorder, bipolar type: Secondary | ICD-10-CM | POA: Diagnosis not present

## 2019-10-24 DIAGNOSIS — F2089 Other schizophrenia: Secondary | ICD-10-CM | POA: Diagnosis not present

## 2019-10-24 DIAGNOSIS — K219 Gastro-esophageal reflux disease without esophagitis: Secondary | ICD-10-CM | POA: Diagnosis not present

## 2019-10-24 DIAGNOSIS — F064 Anxiety disorder due to known physiological condition: Secondary | ICD-10-CM | POA: Diagnosis not present

## 2019-11-01 DIAGNOSIS — Z79899 Other long term (current) drug therapy: Secondary | ICD-10-CM | POA: Diagnosis not present

## 2019-11-01 DIAGNOSIS — F250 Schizoaffective disorder, bipolar type: Secondary | ICD-10-CM | POA: Diagnosis not present

## 2019-11-12 DIAGNOSIS — F250 Schizoaffective disorder, bipolar type: Secondary | ICD-10-CM | POA: Diagnosis not present

## 2019-11-28 DIAGNOSIS — F250 Schizoaffective disorder, bipolar type: Secondary | ICD-10-CM | POA: Diagnosis not present

## 2019-11-28 DIAGNOSIS — Z79899 Other long term (current) drug therapy: Secondary | ICD-10-CM | POA: Diagnosis not present

## 2019-12-05 DIAGNOSIS — M130 Polyarthritis, unspecified: Secondary | ICD-10-CM | POA: Diagnosis not present

## 2019-12-05 DIAGNOSIS — F2089 Other schizophrenia: Secondary | ICD-10-CM | POA: Diagnosis not present

## 2019-12-05 DIAGNOSIS — F064 Anxiety disorder due to known physiological condition: Secondary | ICD-10-CM | POA: Diagnosis not present

## 2019-12-24 DIAGNOSIS — F250 Schizoaffective disorder, bipolar type: Secondary | ICD-10-CM | POA: Diagnosis not present

## 2019-12-24 DIAGNOSIS — Z79899 Other long term (current) drug therapy: Secondary | ICD-10-CM | POA: Diagnosis not present

## 2020-01-09 DIAGNOSIS — F419 Anxiety disorder, unspecified: Secondary | ICD-10-CM | POA: Diagnosis not present

## 2020-01-09 DIAGNOSIS — F250 Schizoaffective disorder, bipolar type: Secondary | ICD-10-CM | POA: Diagnosis not present

## 2020-01-16 ENCOUNTER — Ambulatory Visit: Payer: Medicare Other | Admitting: Podiatry

## 2020-01-16 ENCOUNTER — Other Ambulatory Visit: Payer: Self-pay

## 2020-01-16 ENCOUNTER — Ambulatory Visit (INDEPENDENT_AMBULATORY_CARE_PROVIDER_SITE_OTHER): Payer: Medicare Other

## 2020-01-16 ENCOUNTER — Ambulatory Visit (INDEPENDENT_AMBULATORY_CARE_PROVIDER_SITE_OTHER): Payer: Medicare Other | Admitting: Podiatry

## 2020-01-16 DIAGNOSIS — M722 Plantar fascial fibromatosis: Secondary | ICD-10-CM

## 2020-01-16 MED ORDER — METHYLPREDNISOLONE 4 MG PO TBPK: ORAL_TABLET | ORAL | 0 refills | Status: AC

## 2020-01-16 NOTE — Progress Notes (Signed)
   Subjective: 59 y.o. female presenting as a new patient for evaluation of bilateral heel pain is been going on for approximately 5 months now.  Patient has not done anything for treatment.  She denies trauma.  She presents for further treatment evaluation   Past Medical History:  Diagnosis Date  . Anxiety    pt reported  . Cataract   . Depression    pt reported  . GERD (gastroesophageal reflux disease)    pt reported  . Incontinence 2005  . Schizoaffective disorder ([place])      Objective: Physical Exam General: The patient is alert and oriented x3 in no acute distress.  Dermatology: Skin is warm, dry and supple bilateral lower extremities. Negative for open lesions or macerations bilateral.   Vascular: Dorsalis Pedis and Posterior Tibial pulses palpable bilateral.  Capillary fill time is immediate to all digits.  Neurological: Epicritic and protective threshold intact bilateral.   Musculoskeletal: Tenderness to palpation to the plantar aspect of the bilateral heels along the plantar fascia. All other joints range of motion within normal limits bilateral. Strength 5/5 in all groups bilateral.   Radiographic exam: Normal osseous mineralization. Joint spaces preserved. No fracture/dislocation/boney destruction. No other soft tissue abnormalities or radiopaque foreign bodies.   Assessment: 1. plantar fasciitis bilateral feet  Plan of Care:  1. Patient evaluated. Xrays reviewed.   2. Injection of 0.5cc Celestone soluspan injected into the bilateral heels.  3. Rx for Medrol Dose Pak placed 4.  Patient has GI sensitivity to NSAIDs. 5. Plantar fascial band(s) dispensed for bilateral plantar fasciitis. 6. Instructed patient regarding therapies and modalities at home to alleviate symptoms.  7. Return to clinic in 4 weeks.    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 Edrick Kins, DPM    [address], [place] [address]                Office [phone]  Fax [phone]

## 2020-01-28 DIAGNOSIS — Z79899 Other long term (current) drug therapy: Secondary | ICD-10-CM | POA: Diagnosis not present

## 2020-01-28 DIAGNOSIS — F250 Schizoaffective disorder, bipolar type: Secondary | ICD-10-CM | POA: Diagnosis not present

## 2020-01-30 ENCOUNTER — Encounter: Payer: Self-pay | Admitting: Podiatry

## 2020-01-30 ENCOUNTER — Ambulatory Visit (INDEPENDENT_AMBULATORY_CARE_PROVIDER_SITE_OTHER): Payer: Medicare Other | Admitting: Podiatry

## 2020-01-30 ENCOUNTER — Other Ambulatory Visit: Payer: Self-pay

## 2020-01-30 ENCOUNTER — Other Ambulatory Visit: Payer: Self-pay | Admitting: Podiatry

## 2020-01-30 ENCOUNTER — Ambulatory Visit (INDEPENDENT_AMBULATORY_CARE_PROVIDER_SITE_OTHER): Payer: Medicare Other

## 2020-01-30 DIAGNOSIS — M722 Plantar fascial fibromatosis: Secondary | ICD-10-CM

## 2020-01-30 DIAGNOSIS — S9032XA Contusion of left foot, initial encounter: Secondary | ICD-10-CM | POA: Diagnosis not present

## 2020-01-30 DIAGNOSIS — S92505A Nondisplaced unspecified fracture of left lesser toe(s), initial encounter for closed fracture: Secondary | ICD-10-CM

## 2020-01-30 NOTE — Progress Notes (Signed)
   HPI: 59 y.o. female presenting today for evaluation of an injury that occurred to the left fifth toe.  Patient states that she hit her left fifth toe against the furniture approximately 1 week ago.  DOI: 01/18/2020.  Patient feels that she broke her toe.  She presents for further treatment evaluation.  She has not done anything for treatment currently.  Past Medical History:  Diagnosis Date  . Anxiety    pt reported  . Cataract   . Depression    pt reported  . GERD (gastroesophageal reflux disease)    pt reported  . Incontinence 2005  . Schizoaffective disorder [facility])      Physical Exam: General: The patient is alert and oriented x3 in no acute distress.  Dermatology: Skin is warm, dry and supple bilateral lower extremities. Negative for open lesions or macerations.  Vascular: Palpable pedal pulses bilaterally. No edema or erythema noted. Capillary refill within normal limits.  Neurological: Epicritic and protective threshold grossly intact bilaterally.   Musculoskeletal Exam: Range of motion within normal limits to all pedal and ankle joints bilateral. Muscle strength 5/5 in all groups bilateral.  Pain on palpation with some localized edema around the fifth toe of the left foot  Radiographic Exam:  Normal osseous mineralization. Joint spaces preserved. No fracture/dislocation/boney destruction.  Transverse fracture of the distal phalangeal neck of the proximal phalanx of the fifth toe left foot.  Nondisplaced  Assessment: 1.  Fracture proximal phalanx fifth toe left foot 2.  Plantar fasciitis bilateral-resolved   Plan of Care:  1. Patient evaluated. X-Rays reviewed.  2.  Postsurgical shoe dispensed.  Wear daily 3.  Buddy taping applied.  1 inch Coban provided for additional taping daily 4.  Return to clinic in 4 weeks for follow-up x-rays of the left foot and also to follow-up regarding the plantar fasciitis      Edrick Kins, DPM [facility]  Dr. Edrick Kins, DPM    [address], [place] [address]                Office [phone]  Fax [phone]

## 2020-02-13 ENCOUNTER — Other Ambulatory Visit: Payer: Self-pay

## 2020-02-13 ENCOUNTER — Ambulatory Visit (INDEPENDENT_AMBULATORY_CARE_PROVIDER_SITE_OTHER): Payer: Medicare Other | Admitting: Podiatry

## 2020-02-13 ENCOUNTER — Ambulatory Visit (INDEPENDENT_AMBULATORY_CARE_PROVIDER_SITE_OTHER): Payer: Medicare Other

## 2020-02-13 DIAGNOSIS — S92505A Nondisplaced unspecified fracture of left lesser toe(s), initial encounter for closed fracture: Secondary | ICD-10-CM

## 2020-02-13 DIAGNOSIS — M722 Plantar fascial fibromatosis: Secondary | ICD-10-CM | POA: Diagnosis not present

## 2020-02-13 NOTE — Progress Notes (Signed)
   HPI: 59 y.o. female presenting today for evaluation of an injury that occurred to the left fifth toe.  Patient states that she hit her left fifth toe against the furniture approximately 1 week ago.  DOI: 01/18/2020.  Patient feels that she broke her toe.  She presents for further treatment evaluation.  She has not done anything for treatment currently.  Past Medical History:  Diagnosis Date  . Anxiety    pt reported  . Cataract   . Depression    pt reported  . GERD (gastroesophageal reflux disease)    pt reported  . Incontinence 2005  . Schizoaffective disorder [facility])      Physical Exam: General: The patient is alert and oriented x3 in no acute distress.  Dermatology: Skin is warm, dry and supple bilateral lower extremities. Negative for open lesions or macerations.  Vascular: Palpable pedal pulses bilaterally. No edema or erythema noted. Capillary refill within normal limits.  Neurological: Epicritic and protective threshold grossly intact bilaterally.   Musculoskeletal Exam: Range of motion within normal limits to all pedal and ankle joints bilateral. Muscle strength 5/5 in all groups bilateral.  Pain on palpation with some localized edema around the fifth toe of the left foot  Radiographic Exam:  Normal osseous mineralization. Joint spaces preserved. No fracture/dislocation/boney destruction.  Transverse fracture of the distal phalangeal neck of the proximal phalanx of the fifth toe left foot.  Nondisplaced.  No significant change since last visit.  Routine healing noted  Assessment: 1.  Fracture proximal phalanx fifth toe left foot with routine healing 2.  Plantar fasciitis bilateral-resolved   Plan of Care:  1. Patient evaluated. X-Rays reviewed.  2.  Continue wide fitting shoes that do not irritate the left fifth toe 3.  Continue plantar fascial braces and OTC power step insoles 4.  The patient cannot tolerate the Medrol Dosepak.  Patient also states she only got minimal  relief from the injections. 5.  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      Edrick Kins, DPM [facility]  Dr. Edrick Kins, DPM    [address], [place] [address]                Office [phone]  Fax [phone]

## 2020-02-25 DIAGNOSIS — Z23 Encounter for immunization: Secondary | ICD-10-CM | POA: Diagnosis not present

## 2020-02-27 DIAGNOSIS — F250 Schizoaffective disorder, bipolar type: Secondary | ICD-10-CM | POA: Diagnosis not present

## 2020-02-27 DIAGNOSIS — Z79899 Other long term (current) drug therapy: Secondary | ICD-10-CM | POA: Diagnosis not present

## 2020-03-19 DIAGNOSIS — K2100 Gastro-esophageal reflux disease with esophagitis, without bleeding: Secondary | ICD-10-CM | POA: Diagnosis not present

## 2020-03-19 DIAGNOSIS — F2089 Other schizophrenia: Secondary | ICD-10-CM | POA: Diagnosis not present

## 2020-03-19 DIAGNOSIS — M130 Polyarthritis, unspecified: Secondary | ICD-10-CM | POA: Diagnosis not present

## 2020-03-19 DIAGNOSIS — F064 Anxiety disorder due to known physiological condition: Secondary | ICD-10-CM | POA: Diagnosis not present

## 2020-04-02 DIAGNOSIS — F419 Anxiety disorder, unspecified: Secondary | ICD-10-CM | POA: Diagnosis not present

## 2020-04-02 DIAGNOSIS — F250 Schizoaffective disorder, bipolar type: Secondary | ICD-10-CM | POA: Diagnosis not present

## 2020-04-02 DIAGNOSIS — Z79899 Other long term (current) drug therapy: Secondary | ICD-10-CM | POA: Diagnosis not present

## 2020-04-16 DIAGNOSIS — F250 Schizoaffective disorder, bipolar type: Secondary | ICD-10-CM | POA: Diagnosis not present

## 2020-05-07 DIAGNOSIS — Z20828 Contact with and (suspected) exposure to other viral communicable diseases: Secondary | ICD-10-CM | POA: Diagnosis not present

## 2020-05-14 DIAGNOSIS — Z79899 Other long term (current) drug therapy: Secondary | ICD-10-CM | POA: Diagnosis not present

## 2020-05-14 DIAGNOSIS — F250 Schizoaffective disorder, bipolar type: Secondary | ICD-10-CM | POA: Diagnosis not present

## 2020-06-02 DIAGNOSIS — F250 Schizoaffective disorder, bipolar type: Secondary | ICD-10-CM | POA: Diagnosis not present

## 2020-06-02 DIAGNOSIS — F419 Anxiety disorder, unspecified: Secondary | ICD-10-CM | POA: Diagnosis not present

## 2020-06-19 DIAGNOSIS — Z79899 Other long term (current) drug therapy: Secondary | ICD-10-CM | POA: Diagnosis not present

## 2020-06-19 DIAGNOSIS — F250 Schizoaffective disorder, bipolar type: Secondary | ICD-10-CM | POA: Diagnosis not present

## 2020-06-23 DIAGNOSIS — K219 Gastro-esophageal reflux disease without esophagitis: Secondary | ICD-10-CM | POA: Diagnosis not present

## 2020-06-23 DIAGNOSIS — F064 Anxiety disorder due to known physiological condition: Secondary | ICD-10-CM | POA: Diagnosis not present

## 2020-06-23 DIAGNOSIS — F2089 Other schizophrenia: Secondary | ICD-10-CM | POA: Diagnosis not present

## 2020-06-25 DIAGNOSIS — U099 Post covid-19 condition, unspecified: Secondary | ICD-10-CM | POA: Diagnosis not present

## 2020-06-25 DIAGNOSIS — F250 Schizoaffective disorder, bipolar type: Secondary | ICD-10-CM | POA: Diagnosis not present

## 2020-07-21 DIAGNOSIS — Z79899 Other long term (current) drug therapy: Secondary | ICD-10-CM | POA: Diagnosis not present

## 2020-07-21 DIAGNOSIS — F250 Schizoaffective disorder, bipolar type: Secondary | ICD-10-CM | POA: Diagnosis not present

## 2020-07-24 DIAGNOSIS — Z1211 Encounter for screening for malignant neoplasm of colon: Secondary | ICD-10-CM | POA: Diagnosis not present

## 2020-07-24 DIAGNOSIS — K5904 Chronic idiopathic constipation: Secondary | ICD-10-CM | POA: Diagnosis not present

## 2020-08-18 DIAGNOSIS — Z79899 Other long term (current) drug therapy: Secondary | ICD-10-CM | POA: Diagnosis not present

## 2020-08-18 DIAGNOSIS — F250 Schizoaffective disorder, bipolar type: Secondary | ICD-10-CM | POA: Diagnosis not present

## 2020-08-20 DIAGNOSIS — K5900 Constipation, unspecified: Secondary | ICD-10-CM | POA: Diagnosis not present

## 2020-08-20 DIAGNOSIS — Z1211 Encounter for screening for malignant neoplasm of colon: Secondary | ICD-10-CM | POA: Diagnosis not present

## 2020-08-23 DIAGNOSIS — F064 Anxiety disorder due to known physiological condition: Secondary | ICD-10-CM | POA: Diagnosis not present

## 2020-08-23 DIAGNOSIS — K219 Gastro-esophageal reflux disease without esophagitis: Secondary | ICD-10-CM | POA: Diagnosis not present

## 2020-08-23 DIAGNOSIS — F2089 Other schizophrenia: Secondary | ICD-10-CM | POA: Diagnosis not present

## 2020-08-25 DIAGNOSIS — F419 Anxiety disorder, unspecified: Secondary | ICD-10-CM | POA: Diagnosis not present

## 2020-08-25 DIAGNOSIS — F250 Schizoaffective disorder, bipolar type: Secondary | ICD-10-CM | POA: Diagnosis not present

## 2020-09-11 DIAGNOSIS — K219 Gastro-esophageal reflux disease without esophagitis: Secondary | ICD-10-CM | POA: Diagnosis not present

## 2020-09-11 DIAGNOSIS — R5383 Other fatigue: Secondary | ICD-10-CM | POA: Diagnosis not present

## 2020-09-11 DIAGNOSIS — F064 Anxiety disorder due to known physiological condition: Secondary | ICD-10-CM | POA: Diagnosis not present

## 2020-09-11 DIAGNOSIS — M130 Polyarthritis, unspecified: Secondary | ICD-10-CM | POA: Diagnosis not present

## 2020-09-11 DIAGNOSIS — F250 Schizoaffective disorder, bipolar type: Secondary | ICD-10-CM | POA: Diagnosis not present

## 2020-09-11 DIAGNOSIS — D6489 Other specified anemias: Secondary | ICD-10-CM | POA: Diagnosis not present

## 2020-09-17 DIAGNOSIS — Z1212 Encounter for screening for malignant neoplasm of rectum: Secondary | ICD-10-CM | POA: Diagnosis not present

## 2020-09-17 DIAGNOSIS — Z1211 Encounter for screening for malignant neoplasm of colon: Secondary | ICD-10-CM | POA: Diagnosis not present

## 2020-09-18 DIAGNOSIS — Z79899 Other long term (current) drug therapy: Secondary | ICD-10-CM | POA: Diagnosis not present

## 2020-09-18 DIAGNOSIS — F250 Schizoaffective disorder, bipolar type: Secondary | ICD-10-CM | POA: Diagnosis not present

## 2020-10-08 DIAGNOSIS — H43813 Vitreous degeneration, bilateral: Secondary | ICD-10-CM | POA: Diagnosis not present

## 2020-10-08 DIAGNOSIS — H35362 Drusen (degenerative) of macula, left eye: Secondary | ICD-10-CM | POA: Diagnosis not present

## 2020-10-22 DIAGNOSIS — F250 Schizoaffective disorder, bipolar type: Secondary | ICD-10-CM | POA: Diagnosis not present

## 2020-10-22 DIAGNOSIS — Z79899 Other long term (current) drug therapy: Secondary | ICD-10-CM | POA: Diagnosis not present

## 2020-10-23 DIAGNOSIS — F2089 Other schizophrenia: Secondary | ICD-10-CM | POA: Diagnosis not present

## 2020-10-23 DIAGNOSIS — F5105 Insomnia due to other mental disorder: Secondary | ICD-10-CM | POA: Diagnosis not present

## 2020-10-23 DIAGNOSIS — F064 Anxiety disorder due to known physiological condition: Secondary | ICD-10-CM | POA: Diagnosis not present

## 2020-10-31 DIAGNOSIS — F419 Anxiety disorder, unspecified: Secondary | ICD-10-CM | POA: Diagnosis not present

## 2020-10-31 DIAGNOSIS — F250 Schizoaffective disorder, bipolar type: Secondary | ICD-10-CM | POA: Diagnosis not present

## 2020-11-23 DIAGNOSIS — F2089 Other schizophrenia: Secondary | ICD-10-CM | POA: Diagnosis not present

## 2020-11-23 DIAGNOSIS — F064 Anxiety disorder due to known physiological condition: Secondary | ICD-10-CM | POA: Diagnosis not present

## 2020-11-23 DIAGNOSIS — K219 Gastro-esophageal reflux disease without esophagitis: Secondary | ICD-10-CM | POA: Diagnosis not present

## 2020-11-24 DIAGNOSIS — F250 Schizoaffective disorder, bipolar type: Secondary | ICD-10-CM | POA: Diagnosis not present

## 2020-11-26 DIAGNOSIS — Z1231 Encounter for screening mammogram for malignant neoplasm of breast: Secondary | ICD-10-CM | POA: Diagnosis not present

## 2020-12-02 DIAGNOSIS — F250 Schizoaffective disorder, bipolar type: Secondary | ICD-10-CM | POA: Diagnosis not present

## 2020-12-02 DIAGNOSIS — F419 Anxiety disorder, unspecified: Secondary | ICD-10-CM | POA: Diagnosis not present

## 2020-12-02 DIAGNOSIS — Z79899 Other long term (current) drug therapy: Secondary | ICD-10-CM | POA: Diagnosis not present

## 2020-12-19 DIAGNOSIS — F200 Paranoid schizophrenia: Secondary | ICD-10-CM | POA: Diagnosis not present

## 2020-12-19 DIAGNOSIS — F419 Anxiety disorder, unspecified: Secondary | ICD-10-CM | POA: Diagnosis not present

## 2020-12-24 DIAGNOSIS — F2089 Other schizophrenia: Secondary | ICD-10-CM | POA: Diagnosis not present

## 2020-12-24 DIAGNOSIS — K219 Gastro-esophageal reflux disease without esophagitis: Secondary | ICD-10-CM | POA: Diagnosis not present

## 2020-12-24 DIAGNOSIS — F064 Anxiety disorder due to known physiological condition: Secondary | ICD-10-CM | POA: Diagnosis not present

## 2020-12-30 DIAGNOSIS — F250 Schizoaffective disorder, bipolar type: Secondary | ICD-10-CM | POA: Diagnosis not present

## 2020-12-30 DIAGNOSIS — Z79899 Other long term (current) drug therapy: Secondary | ICD-10-CM | POA: Diagnosis not present

## 2021-01-23 DIAGNOSIS — F064 Anxiety disorder due to known physiological condition: Secondary | ICD-10-CM | POA: Diagnosis not present

## 2021-01-23 DIAGNOSIS — F2089 Other schizophrenia: Secondary | ICD-10-CM | POA: Diagnosis not present

## 2021-01-23 DIAGNOSIS — K219 Gastro-esophageal reflux disease without esophagitis: Secondary | ICD-10-CM | POA: Diagnosis not present

## 2021-01-26 DIAGNOSIS — F250 Schizoaffective disorder, bipolar type: Secondary | ICD-10-CM | POA: Diagnosis not present

## 2021-01-26 DIAGNOSIS — F419 Anxiety disorder, unspecified: Secondary | ICD-10-CM | POA: Diagnosis not present

## 2021-01-28 DIAGNOSIS — Z79899 Other long term (current) drug therapy: Secondary | ICD-10-CM | POA: Diagnosis not present

## 2021-01-28 DIAGNOSIS — F250 Schizoaffective disorder, bipolar type: Secondary | ICD-10-CM | POA: Diagnosis not present

## 2021-02-23 DIAGNOSIS — F250 Schizoaffective disorder, bipolar type: Secondary | ICD-10-CM | POA: Diagnosis not present

## 2021-02-23 DIAGNOSIS — Z79899 Other long term (current) drug therapy: Secondary | ICD-10-CM | POA: Diagnosis not present

## 2021-03-30 DIAGNOSIS — F250 Schizoaffective disorder, bipolar type: Secondary | ICD-10-CM | POA: Diagnosis not present

## 2021-03-30 DIAGNOSIS — Z79899 Other long term (current) drug therapy: Secondary | ICD-10-CM | POA: Diagnosis not present

## 2021-04-06 DIAGNOSIS — F250 Schizoaffective disorder, bipolar type: Secondary | ICD-10-CM | POA: Diagnosis not present

## 2021-04-06 DIAGNOSIS — F419 Anxiety disorder, unspecified: Secondary | ICD-10-CM | POA: Diagnosis not present

## 2021-04-24 DIAGNOSIS — F250 Schizoaffective disorder, bipolar type: Secondary | ICD-10-CM | POA: Diagnosis not present

## 2021-04-24 DIAGNOSIS — Z79899 Other long term (current) drug therapy: Secondary | ICD-10-CM | POA: Diagnosis not present

## 2021-05-27 DIAGNOSIS — F250 Schizoaffective disorder, bipolar type: Secondary | ICD-10-CM | POA: Diagnosis not present

## 2021-05-27 DIAGNOSIS — Z79899 Other long term (current) drug therapy: Secondary | ICD-10-CM | POA: Diagnosis not present

## 2021-06-22 DIAGNOSIS — F419 Anxiety disorder, unspecified: Secondary | ICD-10-CM | POA: Diagnosis not present

## 2021-06-22 DIAGNOSIS — F250 Schizoaffective disorder, bipolar type: Secondary | ICD-10-CM | POA: Diagnosis not present

## 2021-06-25 DIAGNOSIS — F250 Schizoaffective disorder, bipolar type: Secondary | ICD-10-CM | POA: Diagnosis not present

## 2021-06-25 DIAGNOSIS — Z79899 Other long term (current) drug therapy: Secondary | ICD-10-CM | POA: Diagnosis not present

## 2021-07-29 DIAGNOSIS — Z79899 Other long term (current) drug therapy: Secondary | ICD-10-CM | POA: Diagnosis not present

## 2021-07-29 DIAGNOSIS — F250 Schizoaffective disorder, bipolar type: Secondary | ICD-10-CM | POA: Diagnosis not present

## 2021-08-24 DIAGNOSIS — F419 Anxiety disorder, unspecified: Secondary | ICD-10-CM | POA: Diagnosis not present

## 2021-08-24 DIAGNOSIS — F250 Schizoaffective disorder, bipolar type: Secondary | ICD-10-CM | POA: Diagnosis not present

## 2021-08-26 DIAGNOSIS — F250 Schizoaffective disorder, bipolar type: Secondary | ICD-10-CM | POA: Diagnosis not present

## 2021-08-26 DIAGNOSIS — Z79899 Other long term (current) drug therapy: Secondary | ICD-10-CM | POA: Diagnosis not present

## 2021-09-23 DIAGNOSIS — Z79899 Other long term (current) drug therapy: Secondary | ICD-10-CM | POA: Diagnosis not present

## 2021-09-23 DIAGNOSIS — F250 Schizoaffective disorder, bipolar type: Secondary | ICD-10-CM | POA: Diagnosis not present

## 2021-10-21 DIAGNOSIS — Z0000 Encounter for general adult medical examination without abnormal findings: Secondary | ICD-10-CM | POA: Diagnosis not present

## 2021-10-21 DIAGNOSIS — F200 Paranoid schizophrenia: Secondary | ICD-10-CM | POA: Diagnosis not present

## 2021-10-21 DIAGNOSIS — M130 Polyarthritis, unspecified: Secondary | ICD-10-CM | POA: Diagnosis not present

## 2021-10-21 DIAGNOSIS — F5105 Insomnia due to other mental disorder: Secondary | ICD-10-CM | POA: Diagnosis not present

## 2021-10-21 DIAGNOSIS — F250 Schizoaffective disorder, bipolar type: Secondary | ICD-10-CM | POA: Diagnosis not present

## 2021-10-21 DIAGNOSIS — Z6820 Body mass index (BMI) 20.0-20.9, adult: Secondary | ICD-10-CM | POA: Diagnosis not present

## 2021-10-21 DIAGNOSIS — E785 Hyperlipidemia, unspecified: Secondary | ICD-10-CM | POA: Diagnosis not present

## 2021-10-29 DIAGNOSIS — Z79899 Other long term (current) drug therapy: Secondary | ICD-10-CM | POA: Diagnosis not present

## 2021-10-29 DIAGNOSIS — F250 Schizoaffective disorder, bipolar type: Secondary | ICD-10-CM | POA: Diagnosis not present

## 2021-11-05 DIAGNOSIS — F419 Anxiety disorder, unspecified: Secondary | ICD-10-CM | POA: Diagnosis not present

## 2021-11-05 DIAGNOSIS — F250 Schizoaffective disorder, bipolar type: Secondary | ICD-10-CM | POA: Diagnosis not present

## 2021-11-16 DIAGNOSIS — J019 Acute sinusitis, unspecified: Secondary | ICD-10-CM | POA: Diagnosis not present

## 2021-11-25 DIAGNOSIS — F250 Schizoaffective disorder, bipolar type: Secondary | ICD-10-CM | POA: Diagnosis not present

## 2021-11-25 DIAGNOSIS — Z79899 Other long term (current) drug therapy: Secondary | ICD-10-CM | POA: Diagnosis not present

## 2021-12-03 DIAGNOSIS — H35369 Drusen (degenerative) of macula, unspecified eye: Secondary | ICD-10-CM | POA: Diagnosis not present

## 2021-12-03 DIAGNOSIS — H43813 Vitreous degeneration, bilateral: Secondary | ICD-10-CM | POA: Diagnosis not present

## 2021-12-30 DIAGNOSIS — Z79899 Other long term (current) drug therapy: Secondary | ICD-10-CM | POA: Diagnosis not present

## 2021-12-30 DIAGNOSIS — F250 Schizoaffective disorder, bipolar type: Secondary | ICD-10-CM | POA: Diagnosis not present

## 2022-01-21 DIAGNOSIS — F419 Anxiety disorder, unspecified: Secondary | ICD-10-CM | POA: Diagnosis not present

## 2022-01-21 DIAGNOSIS — F250 Schizoaffective disorder, bipolar type: Secondary | ICD-10-CM | POA: Diagnosis not present

## 2022-01-23 DIAGNOSIS — F2089 Other schizophrenia: Secondary | ICD-10-CM | POA: Diagnosis not present

## 2022-01-23 DIAGNOSIS — F064 Anxiety disorder due to known physiological condition: Secondary | ICD-10-CM | POA: Diagnosis not present

## 2022-01-23 DIAGNOSIS — K219 Gastro-esophageal reflux disease without esophagitis: Secondary | ICD-10-CM | POA: Diagnosis not present

## 2022-01-27 DIAGNOSIS — F250 Schizoaffective disorder, bipolar type: Secondary | ICD-10-CM | POA: Diagnosis not present

## 2022-01-27 DIAGNOSIS — Z79899 Other long term (current) drug therapy: Secondary | ICD-10-CM | POA: Diagnosis not present

## 2022-02-04 DIAGNOSIS — H6121 Impacted cerumen, right ear: Secondary | ICD-10-CM | POA: Diagnosis not present

## 2022-02-08 DIAGNOSIS — H6123 Impacted cerumen, bilateral: Secondary | ICD-10-CM | POA: Diagnosis not present

## 2022-02-16 DIAGNOSIS — F200 Paranoid schizophrenia: Secondary | ICD-10-CM | POA: Diagnosis not present

## 2022-02-16 DIAGNOSIS — F419 Anxiety disorder, unspecified: Secondary | ICD-10-CM | POA: Diagnosis not present

## 2022-02-16 DIAGNOSIS — F250 Schizoaffective disorder, bipolar type: Secondary | ICD-10-CM | POA: Diagnosis not present

## 2022-02-16 DIAGNOSIS — F2089 Other schizophrenia: Secondary | ICD-10-CM | POA: Diagnosis not present

## 2022-02-24 DIAGNOSIS — F250 Schizoaffective disorder, bipolar type: Secondary | ICD-10-CM | POA: Diagnosis not present

## 2022-02-24 DIAGNOSIS — Z79899 Other long term (current) drug therapy: Secondary | ICD-10-CM | POA: Diagnosis not present

## 2022-03-15 DIAGNOSIS — F419 Anxiety disorder, unspecified: Secondary | ICD-10-CM | POA: Diagnosis not present

## 2022-03-15 DIAGNOSIS — F2089 Other schizophrenia: Secondary | ICD-10-CM | POA: Diagnosis not present

## 2022-03-15 DIAGNOSIS — E782 Mixed hyperlipidemia: Secondary | ICD-10-CM | POA: Diagnosis not present

## 2022-03-15 DIAGNOSIS — Z6828 Body mass index (BMI) 28.0-28.9, adult: Secondary | ICD-10-CM | POA: Diagnosis not present

## 2022-03-15 DIAGNOSIS — R030 Elevated blood-pressure reading, without diagnosis of hypertension: Secondary | ICD-10-CM | POA: Diagnosis not present

## 2022-03-15 DIAGNOSIS — E559 Vitamin D deficiency, unspecified: Secondary | ICD-10-CM | POA: Diagnosis not present

## 2022-03-15 DIAGNOSIS — Z6825 Body mass index (BMI) 25.0-25.9, adult: Secondary | ICD-10-CM | POA: Diagnosis not present

## 2022-03-24 DIAGNOSIS — F250 Schizoaffective disorder, bipolar type: Secondary | ICD-10-CM | POA: Diagnosis not present

## 2022-03-24 DIAGNOSIS — Z79899 Other long term (current) drug therapy: Secondary | ICD-10-CM | POA: Diagnosis not present

## 2022-03-26 DIAGNOSIS — F419 Anxiety disorder, unspecified: Secondary | ICD-10-CM | POA: Diagnosis not present

## 2022-03-26 DIAGNOSIS — F250 Schizoaffective disorder, bipolar type: Secondary | ICD-10-CM | POA: Diagnosis not present

## 2022-04-23 DIAGNOSIS — F250 Schizoaffective disorder, bipolar type: Secondary | ICD-10-CM | POA: Diagnosis not present

## 2023-01-14 ENCOUNTER — Other Ambulatory Visit: Payer: Self-pay | Admitting: Gastroenterology

## 2023-01-14 DIAGNOSIS — R1310 Dysphagia, unspecified: Secondary | ICD-10-CM

## 2023-01-20 ENCOUNTER — Ambulatory Visit
Admission: RE | Admit: 2023-01-20 | Discharge: 2023-01-20 | Disposition: A | Discharge status: Home / Self Care | Payer: Medicare Other | Source: Ambulatory Visit | Attending: Gastroenterology | Admitting: Gastroenterology

## 2023-01-20 DIAGNOSIS — R1310 Dysphagia, unspecified: Secondary | ICD-10-CM

## 2024-01-05 ENCOUNTER — Other Ambulatory Visit: Payer: Self-pay | Admitting: Family Medicine

## 2024-01-05 ENCOUNTER — Other Ambulatory Visit (HOSPITAL_COMMUNITY)
Admission: RE | Admit: 2024-01-05 | Discharge: 2024-01-05 | Disposition: A | Discharge status: Home / Self Care | Source: Ambulatory Visit | Attending: Family Medicine | Admitting: Family Medicine

## 2024-01-05 ENCOUNTER — Other Ambulatory Visit (HOSPITAL_COMMUNITY): Admission: RE | Admit: 2024-01-05 | Source: Ambulatory Visit

## 2024-01-05 DIAGNOSIS — Z01419 Encounter for gynecological examination (general) (routine) without abnormal findings: Secondary | ICD-10-CM | POA: Insufficient documentation

## 2024-01-05 DIAGNOSIS — Z1151 Encounter for screening for human papillomavirus (HPV): Secondary | ICD-10-CM | POA: Diagnosis not present

## 2024-01-10 LAB — CYTOLOGY - PAP
Comment: NEGATIVE
Diagnosis: NEGATIVE
High risk HPV: NEGATIVE
